# Patient Record
Sex: Male | Born: 1955
Health system: Southern US, Community
[De-identification: ages and names within clinical notes are randomized; demographics above are authoritative.]

## PROBLEM LIST (undated history)

## (undated) DIAGNOSIS — R519 Headache, unspecified: Secondary | ICD-10-CM

## (undated) DIAGNOSIS — F1011 Alcohol abuse, in remission: Secondary | ICD-10-CM

## (undated) DIAGNOSIS — I10 Essential (primary) hypertension: Secondary | ICD-10-CM

## (undated) DIAGNOSIS — G709 Myoneural disorder, unspecified: Secondary | ICD-10-CM

## (undated) DIAGNOSIS — F4310 Post-traumatic stress disorder, unspecified: Secondary | ICD-10-CM

## (undated) DIAGNOSIS — R42 Dizziness and giddiness: Secondary | ICD-10-CM

## (undated) DIAGNOSIS — E119 Type 2 diabetes mellitus without complications: Secondary | ICD-10-CM

## (undated) DIAGNOSIS — R413 Other amnesia: Secondary | ICD-10-CM

## (undated) DIAGNOSIS — F0781 Postconcussional syndrome: Secondary | ICD-10-CM

## (undated) DIAGNOSIS — M1990 Unspecified osteoarthritis, unspecified site: Secondary | ICD-10-CM

## (undated) DIAGNOSIS — F1010 Alcohol abuse, uncomplicated: Secondary | ICD-10-CM

## (undated) DIAGNOSIS — T8859XA Other complications of anesthesia, initial encounter: Secondary | ICD-10-CM

## (undated) DIAGNOSIS — R195 Other fecal abnormalities: Secondary | ICD-10-CM

## (undated) HISTORY — DX: Dizziness and giddiness: R42

## (undated) HISTORY — PX: BACK SURGERY: SHX140

## (undated) HISTORY — DX: Headache, unspecified: R51.9

## (undated) HISTORY — PX: NECK SURGERY: SHX720

## (undated) HISTORY — DX: Type 2 diabetes mellitus without complications: E11.9

## (undated) HISTORY — DX: Other fecal abnormalities: R19.5

## (undated) HISTORY — DX: Postconcussional syndrome: F07.81

## (undated) HISTORY — PX: TONSILLECTOMY: SUR1361

## (undated) HISTORY — DX: Alcohol abuse, in remission: F10.11

## (undated) HISTORY — DX: Other amnesia: R41.3

---

## 2000-07-31 ENCOUNTER — Encounter: Payer: Self-pay | Admitting: Emergency Medicine

## 2000-07-31 ENCOUNTER — Emergency Department (HOSPITAL_COMMUNITY): Admission: EM | Admit: 2000-07-31 | Discharge: 2000-07-31 | Payer: Self-pay | Admitting: Emergency Medicine

## 2000-09-16 ENCOUNTER — Encounter: Payer: Self-pay | Admitting: Specialist

## 2000-09-23 ENCOUNTER — Inpatient Hospital Stay (HOSPITAL_COMMUNITY): Admission: RE | Admit: 2000-09-23 | Discharge: 2000-09-24 | Payer: Self-pay | Admitting: Specialist

## 2000-09-23 ENCOUNTER — Encounter: Payer: Self-pay | Admitting: Specialist

## 2005-05-05 ENCOUNTER — Ambulatory Visit: Payer: Self-pay | Admitting: Internal Medicine

## 2005-05-12 ENCOUNTER — Ambulatory Visit: Payer: Self-pay | Admitting: Internal Medicine

## 2005-07-06 ENCOUNTER — Ambulatory Visit: Payer: Self-pay | Admitting: Internal Medicine

## 2005-07-16 ENCOUNTER — Ambulatory Visit: Payer: Self-pay | Admitting: Internal Medicine

## 2005-09-09 ENCOUNTER — Ambulatory Visit: Payer: Self-pay | Admitting: Internal Medicine

## 2005-09-16 ENCOUNTER — Ambulatory Visit: Payer: Self-pay | Admitting: Internal Medicine

## 2005-12-15 ENCOUNTER — Ambulatory Visit: Payer: Self-pay | Admitting: Internal Medicine

## 2006-12-31 ENCOUNTER — Ambulatory Visit: Payer: Self-pay | Admitting: Internal Medicine

## 2007-03-23 DIAGNOSIS — I10 Essential (primary) hypertension: Secondary | ICD-10-CM | POA: Insufficient documentation

## 2007-03-23 DIAGNOSIS — J309 Allergic rhinitis, unspecified: Secondary | ICD-10-CM | POA: Insufficient documentation

## 2007-03-23 DIAGNOSIS — E785 Hyperlipidemia, unspecified: Secondary | ICD-10-CM | POA: Insufficient documentation

## 2007-05-11 ENCOUNTER — Telehealth: Payer: Self-pay | Admitting: Internal Medicine

## 2010-09-30 NOTE — Progress Notes (Signed)
Summary: name of endocrinologist  Phone Note Call from Patient Call back at Home Phone [phone]   Caller: Patient Call For: Aanyah Loa Reason for Call: Referral Summary of Call: NEED A REFERRAL FOR ENDOCRINOLOGIST  DO YOU ACCEPT NEW PTS?  PLS CALL FOR REFERRAL Initial call taken by: Shan Levans,  May 11, 2007 3:44 PM  Follow-up for Phone Call        more information---I don't understand the question Follow-up by: Birdie Sons MD,  May 11, 2007 4:03 PM  Additional Follow-up for Phone Call Additional follow up Details #1::        LMTCB ..................................................................Marland KitchenSid Falcon LPN  May 11, 2007 6:02 PM  Called pt back, found out pt. step daughter has a nodule on her neck, identified on sonogram and a needle biopsy has been done.  They are needing an Actor.  On Internet search they found your name, Dr Birdie Sons come up.  Only other one in this area is Dr Criss Alvine, and in their checking, no one has heard of him/her.  They are looking for someone in the [employer].  Any advice?  Additional Follow-up by: Sid Falcon LPN,  May 12, 2007 5:22 PM    Additional Follow-up for Phone Call Additional follow up Details #2::    refer to Dr. Everardo All Follow-up by: Birdie Sons MD,  May 12, 2007 9:10 PM  Additional Follow-up for Phone Call Additional follow up Details #3:: Details for Additional Follow-up Action Taken: Patient called with information. Additional Follow-up by: Rudy Jew, RN,  May 13, 2007 3:04 PM

## 2016-08-13 ENCOUNTER — Ambulatory Visit (HOSPITAL_COMMUNITY)
Admission: RE | Admit: 2016-08-13 | Discharge: 2016-08-13 | Disposition: A | Discharge status: Home / Self Care | Payer: Self-pay | Attending: Psychiatry | Admitting: Psychiatry

## 2016-08-13 DIAGNOSIS — F418 Other specified anxiety disorders: Secondary | ICD-10-CM | POA: Insufficient documentation

## 2016-08-13 DIAGNOSIS — F1910 Other psychoactive substance abuse, uncomplicated: Secondary | ICD-10-CM | POA: Insufficient documentation

## 2016-08-13 NOTE — BH Assessment (Signed)
Tele Assessment Note   Chris Flynn is an 60 y.o. male. Pt reports increased depression, anxiety, and alcohol abuse due to recent family trauma. The Pt states that he his family trauma occurred his world changed and his life has never been the same. The Pt denies mental health symptoms or alcohol abuse before the trauma occurred. The Pt is seen by his PCP for withdrawal symptoms. The Pt is seen by a therapist 1x a month to discuss his feeling and emotions. The Pt denies SI/HI and AVH. The Pt denies previous inpatient treatment. The Pt states he is prescribed Lorazepam by his PCP. The Pt states he is seeking the help of a psychiatrist for medication management.   Writer consulted with Margarita Grizzle, NP. Per Margarita Grizzle Pt does not meet inpatient criteria. Pt provided the Pt with resources.  Diagnosis: F33.1 MDD  Past Medical History: No past medical history on file.  No past surgical history on file.  Family History: No family history on file.  Social History:  has no tobacco, alcohol, and drug history on file.  Additional Social History:  Alcohol / Drug Use Pain Medications: Pt denies Prescriptions: Lorezapam Over the Counter: Pt denies History of alcohol / drug use?: Yes Longest period of sobriety (when/how long): unknown Withdrawal Symptoms: Agitation, Sweats, Fever / Chills, Irritability Substance #1 Name of Substance 1: alcohol 1 - Age of First Use: unknown 1 - Amount (size/oz): unknown 1 - Frequency: occasional 1 - Duration: ongoing 1 - Last Use / Amount: unknown  CIWA: CIWA-Ar BP: 136/82 Pulse Rate: 94 COWS:    PATIENT STRENGTHS: (choose at least two) Capable of independent living Communication skills  Allergies:  Allergies  Allergen Reactions  . Codeine     REACTION: Facial numbness    Home Medications:  (Not in a hospital admission)  OB/GYN Status:  No LMP for male patient.  General Assessment Data Location of Assessment: [facility] Assessment Services TTS Assessment:  In system Is this a Tele or Face-to-Face Assessment?: Face-to-Face Is this an Initial Assessment or a Re-assessment for this encounter?: Initial Assessment Marital status: Married [place] name: NA Is patient pregnant?: No Pregnancy Status: No Living Arrangements: Spouse/significant other Can pt return to current living arrangement?: Yes Admission Status: Voluntary Is patient capable of signing voluntary admission?: Yes Referral Source: Self/Family/Friend Insurance type: Insurance risk surveyor Exam ([place]) Medical Exam completed: Yes (completed by Margarita Grizzle, NP)  Crisis Care Plan Living Arrangements: Spouse/significant other Legal Guardian: Other: (self) Name of Psychiatrist: NA Name of Therapist: [place] [facility]  Education Status Is patient currently in school?: No Current Grade: NA Highest grade of school patient has completed: college Name of school: NA Contact person: NA  Risk to self with the past 6 months Suicidal Ideation: No Has patient been a risk to self within the past 6 months prior to admission? : No Suicidal Intent: No Has patient had any suicidal intent within the past 6 months prior to admission? : No Is patient at risk for suicide?: No Suicidal Plan?: No Has patient had any suicidal plan within the past 6 months prior to admission? : No Access to Means: No What has been your use of drugs/alcohol within the last 12 months?: NA Previous Attempts/Gestures: No How many times?: 0 Other Self Harm Risks: NA Triggers for Past Attempts: None known Intentional Self Injurious Behavior: None Family Suicide History: No Recent stressful life event(s): Trauma (Comment), Turmoil (Comment) Persecutory voices/beliefs?: No Depression: Yes Depression Symptoms: Isolating, Tearfulness, Loss of interest  in usual pleasures, Feeling angry/irritable Substance abuse history and/or treatment for substance abuse?: No Suicide prevention information given to  non-admitted patients: Not applicable  Risk to Others within the past 6 months Homicidal Ideation: No Does patient have any lifetime risk of violence toward others beyond the six months prior to admission? : No Thoughts of Harm to Others: No Current Homicidal Intent: No Current Homicidal Plan: No Access to Homicidal Means: No Identified Victim: NA History of harm to others?: No Assessment of Violence: None Noted Violent Behavior Description: NA Does patient have access to weapons?: No Criminal Charges Pending?: No Does patient have a court date: No Is patient on probation?: No  Psychosis Hallucinations: None noted Delusions: None noted  Mental Status Report Appearance/Hygiene: Unremarkable Eye Contact: Good Motor Activity: Freedom of movement Speech: Logical/coherent Level of Consciousness: Alert Mood: Sad Affect: Sad Anxiety Level: Minimal Thought Processes: Coherent, Relevant Judgement: Unimpaired Orientation: Person, Place, Situation, Time, Appropriate for developmental age Obsessive Compulsive Thoughts/Behaviors: None  Cognitive Functioning Concentration: Normal Memory: Recent Intact, Remote Intact IQ: Average Insight: Good Impulse Control: Good Appetite: Good Weight Loss: 0 Weight Gain: 0 Sleep: Decreased Total Hours of Sleep: 6 Vegetative Symptoms: None  ADLScreening [facility]) Patient's cognitive ability adequate to safely complete daily activities?: Yes Patient able to express need for assistance with ADLs?: Yes Independently performs ADLs?: Yes (appropriate for developmental age)  Prior Inpatient Therapy Prior Inpatient Therapy: No Prior Therapy Dates: NA Prior Therapy Facilty/Provider(s): NA Reason for Treatment: NA  Prior Outpatient Therapy Prior Outpatient Therapy: No Prior Therapy Dates: NA Prior Therapy Facilty/Provider(s): nA Reason for Treatment: NA Does patient have an ACCT team?: No Does patient have Intensive In-House  Services?  : No Does patient have [facility] services? : No Does patient have P4CC services?: No  ADL Screening (condition at time of admission) Patient's cognitive ability adequate to safely complete daily activities?: Yes Is the patient deaf or have difficulty hearing?: No Does the patient have difficulty seeing, even when wearing glasses/contacts?: No Does the patient have difficulty concentrating, remembering, or making decisions?: No Patient able to express need for assistance with ADLs?: Yes Does the patient have difficulty dressing or bathing?: No Independently performs ADLs?: Yes (appropriate for developmental age) Does the patient have difficulty walking or climbing stairs?: No Weakness of Legs: None Weakness of Arms/Hands: None       Abuse/Neglect Assessment (Assessment to be complete while patient is alone) Physical Abuse: Denies Verbal Abuse: Denies Sexual Abuse: Denies Exploitation of patient/patient's resources: Denies Self-Neglect: Denies     Regulatory affairs officer (For Healthcare) Does Patient Have a Medical Advance Directive?: No    Additional Information 1:1 In Past 12 Months?: No CIRT Risk: No Elopement Risk: No Does patient have medical clearance?: Yes     Disposition:  Disposition Initial Assessment Completed for this Encounter: Yes Disposition of Patient: Outpatient treatment Type of outpatient treatment: Adult  Rael Tilly D 08/13/2016 11:43 AM

## 2016-08-13 NOTE — H&P (Signed)
Behavioral Health Medical Screening Exam  Chris Flynn is an 60 y.o. male.  Total Time spent with patient: 15 minutes  Psychiatric Specialty Exam: Physical Exam  Constitutional: He is oriented to person, place, and time. He appears well-developed and well-nourished.  HENT:  Head: Normocephalic.  Neck: Normal range of motion.  Cardiovascular: Normal rate and normal heart sounds.   Respiratory: Effort normal and breath sounds normal.  GI: Soft.  Musculoskeletal: Normal range of motion.  Neurological: He is alert and oriented to person, place, and time.  Skin: Skin is warm and dry.    Review of Systems  Psychiatric/Behavioral: Positive for depression and substance abuse. Negative for hallucinations, memory loss and suicidal ideas. The patient is nervous/anxious. The patient does not have insomnia.     BP 136/82 (BP Location: Right Arm)   Pulse 94   Temp 99.2 F (37.3 C) (Oral)   Resp 18   SpO2 96%    General Appearance: Casual and Fairly Groomed  Eye Contact:  Good  Speech:  Clear and Coherent and Normal Rate  Volume:  Normal  Mood:  Anxious and Depressed  Affect:  Congruent and Depressed  Thought Process:  Coherent, Goal Directed and Linear  Orientation:  Full (Time, Place, and Person)  Thought Content:  Illogical  Suicidal Thoughts:  No  Homicidal Thoughts:  No  Memory:  Immediate;   Good Recent;   Good Remote;   Good  Judgement:  Good  Insight:  Good  Psychomotor Activity:  Normal  Concentration: Concentration: Good and Attention Span: Good  Recall:  Good  Fund of Knowledge:Good  Language: Good  Akathisia:  No  Handed:  Right  AIMS (if indicated):     Assets:  Communication Skills Desire for Improvement Financial Resources/Insurance Housing Resilience Social Support Transportation Vocational/Educational  Sleep:       Musculoskeletal: Strength & Muscle Tone: within normal limits Gait & Station: normal Patient leans: N/A  BP 136/82 (BP Location:  Right Arm)   Pulse 94   Temp 99.2 F (37.3 C) (Oral)   Resp 18   SpO2 96%   Recommendations:  Pt does not meet criteria for inpatient psychiatric admission. Outpatient resources were provided  Ethelene Hal, NP 08/13/2016, 11:36 AM

## 2017-03-18 ENCOUNTER — Encounter (HOSPITAL_COMMUNITY): Payer: Self-pay | Admitting: *Deleted

## 2017-03-18 ENCOUNTER — Inpatient Hospital Stay (HOSPITAL_COMMUNITY)
Admission: EM | Admit: 2017-03-18 | Discharge: 2017-03-22 | DRG: 682 | Disposition: A | Discharge status: Home / Self Care | Payer: BLUE CROSS/BLUE SHIELD | Attending: Internal Medicine | Admitting: Internal Medicine

## 2017-03-18 ENCOUNTER — Inpatient Hospital Stay (HOSPITAL_COMMUNITY): Payer: BLUE CROSS/BLUE SHIELD

## 2017-03-18 DIAGNOSIS — F4310 Post-traumatic stress disorder, unspecified: Secondary | ICD-10-CM | POA: Diagnosis present

## 2017-03-18 DIAGNOSIS — K859 Acute pancreatitis without necrosis or infection, unspecified: Secondary | ICD-10-CM | POA: Diagnosis present

## 2017-03-18 DIAGNOSIS — Z79899 Other long term (current) drug therapy: Secondary | ICD-10-CM

## 2017-03-18 DIAGNOSIS — E876 Hypokalemia: Secondary | ICD-10-CM | POA: Diagnosis present

## 2017-03-18 DIAGNOSIS — K852 Alcohol induced acute pancreatitis without necrosis or infection: Secondary | ICD-10-CM

## 2017-03-18 DIAGNOSIS — N179 Acute kidney failure, unspecified: Principal | ICD-10-CM

## 2017-03-18 DIAGNOSIS — E869 Volume depletion, unspecified: Secondary | ICD-10-CM | POA: Diagnosis present

## 2017-03-18 DIAGNOSIS — R740 Nonspecific elevation of levels of transaminase and lactic acid dehydrogenase [LDH]: Secondary | ICD-10-CM | POA: Diagnosis present

## 2017-03-18 DIAGNOSIS — D696 Thrombocytopenia, unspecified: Secondary | ICD-10-CM | POA: Diagnosis present

## 2017-03-18 DIAGNOSIS — F419 Anxiety disorder, unspecified: Secondary | ICD-10-CM | POA: Diagnosis present

## 2017-03-18 DIAGNOSIS — E871 Hypo-osmolality and hyponatremia: Secondary | ICD-10-CM

## 2017-03-18 DIAGNOSIS — R7401 Elevation of levels of liver transaminase levels: Secondary | ICD-10-CM

## 2017-03-18 DIAGNOSIS — M6282 Rhabdomyolysis: Secondary | ICD-10-CM | POA: Diagnosis present

## 2017-03-18 DIAGNOSIS — F10930 Alcohol use, unspecified with withdrawal, uncomplicated: Secondary | ICD-10-CM

## 2017-03-18 DIAGNOSIS — F10230 Alcohol dependence with withdrawal, uncomplicated: Secondary | ICD-10-CM

## 2017-03-18 DIAGNOSIS — F1020 Alcohol dependence, uncomplicated: Secondary | ICD-10-CM | POA: Diagnosis not present

## 2017-03-18 DIAGNOSIS — I10 Essential (primary) hypertension: Secondary | ICD-10-CM | POA: Diagnosis present

## 2017-03-18 DIAGNOSIS — F10239 Alcohol dependence with withdrawal, unspecified: Secondary | ICD-10-CM | POA: Diagnosis not present

## 2017-03-18 DIAGNOSIS — Y906 Blood alcohol level of 120-199 mg/100 ml: Secondary | ICD-10-CM | POA: Diagnosis present

## 2017-03-18 DIAGNOSIS — R252 Cramp and spasm: Secondary | ICD-10-CM | POA: Diagnosis not present

## 2017-03-18 HISTORY — DX: Essential (primary) hypertension: I10

## 2017-03-18 HISTORY — DX: Post-traumatic stress disorder, unspecified: F43.10

## 2017-03-18 HISTORY — DX: Alcohol abuse, uncomplicated: F10.10

## 2017-03-18 LAB — COMPREHENSIVE METABOLIC PANEL
ALT: 62 U/L (ref 17–63)
AST: 170 U/L — ABNORMAL HIGH (ref 15–41)
Albumin: 4.1 g/dL (ref 3.5–5.0)
Alkaline Phosphatase: 77 U/L (ref 38–126)
Anion gap: 18 — ABNORMAL HIGH (ref 5–15)
BUN: 25 mg/dL — ABNORMAL HIGH (ref 6–20)
CO2: 17 mmol/L — ABNORMAL LOW (ref 22–32)
Calcium: 7.3 mg/dL — ABNORMAL LOW (ref 8.9–10.3)
Chloride: 90 mmol/L — ABNORMAL LOW (ref 101–111)
Creatinine, Ser: 2.53 mg/dL — ABNORMAL HIGH (ref 0.61–1.24)
GFR calc Af Amer: 30 mL/min — ABNORMAL LOW (ref >60)
GFR calc non Af Amer: 26 mL/min — ABNORMAL LOW (ref >60)
Glucose, Bld: 126 mg/dL — ABNORMAL HIGH (ref 65–99)
Potassium: 3.4 mmol/L — ABNORMAL LOW (ref 3.5–5.1)
Sodium: 125 mmol/L — ABNORMAL LOW (ref 135–145)
Total Bilirubin: 1 mg/dL (ref 0.3–1.2)
Total Protein: 7.8 g/dL (ref 6.5–8.1)

## 2017-03-18 LAB — CBC WITH DIFFERENTIAL/PLATELET
Basophils Absolute: 0 10*3/uL (ref 0.0–0.1)
Basophils Relative: 0 %
Eosinophils Absolute: 0 10*3/uL (ref 0.0–0.7)
Eosinophils Relative: 0 %
HCT: 36.2 % — ABNORMAL LOW (ref 39.0–52.0)
Hemoglobin: 13.3 g/dL (ref 13.0–17.0)
Lymphocytes Relative: 10 %
Lymphs Abs: 1.2 10*3/uL (ref 0.7–4.0)
MCH: 35.6 pg — ABNORMAL HIGH (ref 26.0–34.0)
MCHC: 36.7 g/dL — ABNORMAL HIGH (ref 30.0–36.0)
MCV: 96.8 fL (ref 78.0–100.0)
Monocytes Absolute: 1.4 10*3/uL — ABNORMAL HIGH (ref 0.1–1.0)
Monocytes Relative: 11 %
Neutro Abs: 9.5 10*3/uL — ABNORMAL HIGH (ref 1.7–7.7)
Neutrophils Relative %: 78 %
Platelets: 124 10*3/uL — ABNORMAL LOW (ref 150–400)
RBC: 3.74 MIL/uL — ABNORMAL LOW (ref 4.22–5.81)
RDW: 12.7 % (ref 11.5–15.5)
WBC: 12.2 10*3/uL — ABNORMAL HIGH (ref 4.0–10.5)

## 2017-03-18 LAB — RAPID URINE DRUG SCREEN, HOSP PERFORMED
Amphetamines: NOT DETECTED
Barbiturates: NOT DETECTED
Benzodiazepines: NOT DETECTED
Cocaine: NOT DETECTED
Opiates: NOT DETECTED
Tetrahydrocannabinol: NOT DETECTED

## 2017-03-18 LAB — PHOSPHORUS: Phosphorus: 4.3 mg/dL (ref 2.5–4.6)

## 2017-03-18 LAB — PROTIME-INR
INR: 1.02
Prothrombin Time: 13.4 seconds (ref 11.4–15.2)

## 2017-03-18 LAB — CK: Total CK: 5734 U/L — ABNORMAL HIGH (ref 49–397)

## 2017-03-18 LAB — ETHANOL: Alcohol, Ethyl (B): 170 mg/dL — ABNORMAL HIGH (ref <5)

## 2017-03-18 LAB — TSH: TSH: 2.528 u[IU]/mL (ref 0.350–4.500)

## 2017-03-18 LAB — MAGNESIUM: Magnesium: 0.6 mg/dL — CL (ref 1.7–2.4)

## 2017-03-18 MED ORDER — LORAZEPAM 2 MG/ML IJ SOLN: 1.0000 mg | Freq: Four times a day (QID) | INTRAMUSCULAR | Status: AC | PRN

## 2017-03-18 MED ORDER — LORAZEPAM 2 MG/ML IJ SOLN
2.0000 mg | Freq: Once | INTRAMUSCULAR | Status: AC
  Administered 2017-03-18: 2 mg via INTRAVENOUS
  Filled 2017-03-18: qty 1

## 2017-03-18 MED ORDER — LORAZEPAM 1 MG PO TABS: 1.0000 mg | ORAL_TABLET | Freq: Three times a day (TID) | ORAL | Status: DC

## 2017-03-18 MED ORDER — FOLIC ACID 1 MG PO TABS
1.0000 mg | ORAL_TABLET | Freq: Every day | ORAL | Status: DC
  Administered 2017-03-18 – 2017-03-22 (×5): 1 mg via ORAL
  Filled 2017-03-18 (×5): qty 1

## 2017-03-18 MED ORDER — CARVEDILOL 3.125 MG PO TABS
6.2500 mg | ORAL_TABLET | Freq: Two times a day (BID) | ORAL | Status: DC
  Administered 2017-03-18 – 2017-03-22 (×7): 6.25 mg via ORAL
  Filled 2017-03-18 (×8): qty 2

## 2017-03-18 MED ORDER — ONDANSETRON 4 MG PO TBDP
ORAL_TABLET | ORAL | Status: AC
  Administered 2017-03-18: 4 mg
  Filled 2017-03-18: qty 1

## 2017-03-18 MED ORDER — LORAZEPAM 2 MG/ML IJ SOLN: 0.0000 mg | Freq: Two times a day (BID) | INTRAMUSCULAR | Status: DC

## 2017-03-18 MED ORDER — THIAMINE HCL 100 MG/ML IJ SOLN: 100.0000 mg | Freq: Every day | INTRAMUSCULAR | Status: DC

## 2017-03-18 MED ORDER — VITAMIN B-1 100 MG PO TABS
100.0000 mg | ORAL_TABLET | Freq: Every day | ORAL | Status: DC
Start: 2017-03-18 — End: 2017-03-22
  Administered 2017-03-18 – 2017-03-22 (×5): 100 mg via ORAL
  Filled 2017-03-18 (×5): qty 1

## 2017-03-18 MED ORDER — LORAZEPAM 1 MG PO TABS: 1.0000 mg | ORAL_TABLET | Freq: Four times a day (QID) | ORAL | Status: AC | PRN

## 2017-03-18 MED ORDER — MAGNESIUM SULFATE 4 GM/100ML IV SOLN
4.0000 g | Freq: Once | INTRAVENOUS | Status: AC
  Administered 2017-03-18: 4 g via INTRAVENOUS
  Filled 2017-03-18 (×2): qty 100

## 2017-03-18 MED ORDER — ADULT MULTIVITAMIN W/MINERALS CH
1.0000 | ORAL_TABLET | Freq: Every day | ORAL | Status: DC
  Administered 2017-03-18 – 2017-03-22 (×5): 1 via ORAL
  Filled 2017-03-18 (×5): qty 1

## 2017-03-18 MED ORDER — SODIUM CHLORIDE 0.9 % IV BOLUS (SEPSIS)
1000.0000 mL | Freq: Once | INTRAVENOUS | Status: AC
  Administered 2017-03-18: 1000 mL via INTRAVENOUS

## 2017-03-18 MED ORDER — LORAZEPAM 2 MG/ML IJ SOLN
0.0000 mg | Freq: Four times a day (QID) | INTRAMUSCULAR | Status: DC
  Administered 2017-03-18: 2 mg via INTRAVENOUS
  Administered 2017-03-19: 1 mg via INTRAVENOUS
  Filled 2017-03-18: qty 1
  Filled 2017-03-18: qty 2

## 2017-03-18 MED ORDER — SODIUM CHLORIDE 0.9% FLUSH
3.0000 mL | Freq: Two times a day (BID) | INTRAVENOUS | Status: DC
  Administered 2017-03-18 – 2017-03-19 (×3): 3 mL via INTRAVENOUS

## 2017-03-18 MED ORDER — SODIUM CHLORIDE 0.9 % IV SOLN
2.0000 g | Freq: Once | INTRAVENOUS | Status: AC
  Administered 2017-03-18: 2 g via INTRAVENOUS
  Filled 2017-03-18: qty 20

## 2017-03-18 MED ORDER — KCL IN DEXTROSE-NACL 20-5-0.9 MEQ/L-%-% IV SOLN
INTRAVENOUS | Status: DC
  Administered 2017-03-18 – 2017-03-22 (×11): via INTRAVENOUS

## 2017-03-18 NOTE — ED Notes (Signed)
Dr. Le in room

## 2017-03-18 NOTE — ED Triage Notes (Signed)
Flank pain radiating into lower abdomen, denies nausea or vomiting

## 2017-03-18 NOTE — H&P (Signed)
History and Physical    Chris Flynn ZJI:[id number] DOB: 1955/11/30 DOA: 03/18/2017  PCP: Celene Squibb, MD  Patient coming from: Home.    Chief Complaint:   Muscle cramps.    HPI: Chris Flynn is an 61 y.o. male with hx of heavy alcohol abuse (1/2 pint Vodka daily), hx of anxiety on Ativan 1mg  TID (he takes less), HTN on Losartan HCT, PTSS, presented today after several days of leg cramps.  He denied fever or chills.  Work up in the ER included a CPK of 5K, Cr of 2.5, K of 3.4 and Na of 125.  His calcium was also found to be low at 7.3 with normal albumin.  EKG showed ST at 110 without any acute changes.  He was given IV ativan, and hospitalist was asked to admit him for alcohol withdrawal, AKI, mild rhabdo and cramps.   ED Course:  See above.  Rewiew of Systems:  Constitutional: Negative for malaise, fever and chills. No significant weight loss or weight gain Eyes: Negative for eye pain, redness and discharge, diplopia, visual changes, or flashes of light. ENMT: Negative for ear pain, hoarseness, nasal congestion, sinus pressure and sore throat. No headaches; tinnitus, drooling, or problem swallowing. Cardiovascular: Negative for chest pain, palpitations, diaphoresis, dyspnea and peripheral edema. ; No orthopnea, PND Respiratory: Negative for cough, hemoptysis, wheezing and stridor. No pleuritic chestpain. Gastrointestinal: Negative for diarrhea, constipation,  melena, blood in stool, hematemesis, jaundice and rectal bleeding.    Genitourinary: Negative for frequency, dysuria, incontinence,flank pain and hematuria; Musculoskeletal: Negative for back pain and neck pain. Negative for swelling and trauma.;  Skin: . Negative for pruritus, rash, abrasions, bruising and skin lesion.; ulcerations Neuro: Negative for headache, lightheadedness and neck stiffness. Negative for weakness, altered level of consciousness , altered mental status, extremity weakness, burning feet, involuntary  movement, seizure and syncope.  Psych: negative for anxiety, depression, insomnia, tearfulness, panic attacks, hallucinations, paranoia, suicidal or homicidal ideation    Past Medical History:  Diagnosis Date  . Alcohol abuse   . Hypertension   . PTSD (post-traumatic stress disorder)     Past Surgical History:  Procedure Laterality Date  . NECK SURGERY       reports that he has never smoked. He has never used smokeless tobacco. He reports that he drinks alcohol. He reports that he does not use drugs.  Allergies  Allergen Reactions  . Codeine     REACTION: Facial numbness    No family history on file.   Prior to Admission medications   Medication Sig Start Date End Date Taking? Authorizing Provider  carvedilol (COREG) 6.25 MG tablet Take 1 tablet by mouth 2 (two) times daily. 03/12/17  Yes [provider]  LORazepam (ATIVAN) 1 MG tablet Take 1 tablet by mouth 3 (three) times daily. 03/13/17  Yes [provider]  losartan-hydrochlorothiazide (HYZAAR) 100-25 MG tablet Take 1 tablet by mouth daily. 03/12/17  Yes [provider]    Physical Exam: Vitals:   03/18/17 1845 03/18/17 1900 03/18/17 1912 03/18/17 1930  BP:  (!) 147/90 (!) 147/90 (!) 141/81  Pulse: (!) 111 (!) 110 98 (!) 106  Resp:   19   Temp:      TempSrc:      SpO2: 95% 94% 94% 95%      Constitutional: NAD, calm, comfortable Vitals:   03/18/17 1845 03/18/17 1900 03/18/17 1912 03/18/17 1930  BP:  (!) 147/90 (!) 147/90 (!) 141/81  Pulse: (!) 111 (!) 110  98 (!) 106  Resp:   19   Temp:      TempSrc:      SpO2: 95% 94% 94% 95%   Eyes: PERRL, lids and conjunctivae normal ENMT: Mucous membranes are moist. Posterior pharynx clear of any exudate or lesions.Normal dentition.  Neck: normal, supple, no masses, no thyromegaly Respiratory: clear to auscultation bilaterally, no wheezing, no crackles. Normal respiratory effort. No accessory muscle use.  Cardiovascular: Regular rate and  rhythm, no murmurs / rubs / gallops. No extremity edema. 2+ pedal pulses. No carotid bruits.  Abdomen: no tenderness, no masses palpated. No hepatosplenomegaly. Bowel sounds positive.  Musculoskeletal: no clubbing / cyanosis. No joint deformity upper and lower extremities. Good ROM, no contractures. Normal muscle tone.  Skin: no rashes, lesions, ulcers. No induration Neurologic: CN 2-12 grossly intact. Sensation intact, DTR normal. Strength 5/5 in all 4. Mild tremor.  Psychiatric: Normal judgment and insight. Alert and oriented x 3. Normal mood.    Labs on Admission: I have personally reviewed following labs and imaging studies  CBC:  Recent Labs Lab 03/18/17 1626  WBC 12.2*  NEUTROABS 9.5*  HGB 13.3  HCT 36.2*  MCV 96.8  PLT 300*   Basic Metabolic Panel:  Recent Labs Lab 03/18/17 1626  NA 125*  K 3.4*  CL 90*  CO2 17*  GLUCOSE 126*  BUN 25*  CREATININE 2.53*  CALCIUM 7.3*   GFR: CrCl cannot be calculated (Unknown ideal weight.). Liver Function Tests:  Recent Labs Lab 03/18/17 1626  AST 170*  ALT 62  ALKPHOS 77  BILITOT 1.0  PROT 7.8  ALBUMIN 4.1   No results for input(s): LIPASE, AMYLASE in the last 168 hours. No results for input(s): AMMONIA in the last 168 hours. Coagulation Profile: No results for input(s): INR, PROTIME in the last 168 hours. Cardiac Enzymes:  Recent Labs Lab 03/18/17 1626  CKTOTAL 5,734*   EKG: Independently reviewed.   Assessment/Plan Principal Problem:   AKI (acute kidney injury) ([place]) Active Problems:   Rhabdomyolysis   Alcohol dependence with uncomplicated withdrawal ([place])   Hypocalcemia   PLAN:   AKI:  I suspect he is volume depleted, being on diuretic and ACE I, and drinking alcohol.  Will give aggressive IVF.  Follow cr carefully.  Hold or d/c Losartan HCT.  Alcohol withdrawal:  His current alcohol level is 170.  He may get worse in day 2 or day 3.  Will use standing tapering dose of Ativan, along with giving  Dextrose, MVI, folate and Thiamine.  Will admit to telemetry, and transfer to ICU if required.  Rhabdo:  Could be from electrolyte abnormality, or tremor.  History did not suggest that he had a seizure.    Hypocalcemia:  Will give IV supplement.    Alcohol abuse:  Will need to address again at a later time.    DVT prophylaxis: SCD.  Code Status: FULL CODE.  Family Communication: none at bedside.  Disposition Plan: Home.  Consults called: None. Admission status: FULL Haskel Khan MD FACP. [facility]  If 7PM-7AM, please contact night-coverage [email or web address] Password [facility]  03/18/2017, 7:56 PM

## 2017-03-18 NOTE — ED Triage Notes (Signed)
States he is an alcoholic and has been trying to cut back, today is having diarrhea,  Nausea, and vomiting, last alcohol around noon

## 2017-03-18 NOTE — ED Notes (Signed)
Patient transported to X-ray 

## 2017-03-18 NOTE — ED Provider Notes (Signed)
Emergency Department Provider Note   I have reviewed the triage vital signs and the nursing notes.   HISTORY  Chief Complaint Withdrawal   HPI Chris Flynn is a 61 y.o. male with PMH of alcohol abuse and HTN presents to the emergency room in for evaluation of abdominal discomfort, diarrhea, muscle cramping, and alcohol abuse. Patient states he drinks approximately half pint of liquor daily for the last month. He has no history of seizure or delirium tremens when abstaining from alcohol. Over the past 2 weeks he's had worsening diarrhea and abdominal discomfort which she attributed to his drinking. The symptoms have become worse over the past several days and so he decided that he should get help with his drinking. Denies drug use. No new medications. No fevers or chills. Denies chest pain or difficulty breathing. Patient states he follows with Dr. Nevada Crane, as his PCP, and reports normal blood work 6 months prior.    Past Medical History:  Diagnosis Date  . Alcohol abuse   . Hypertension   . PTSD (post-traumatic stress disorder)     Patient Active Problem List   Diagnosis Date Noted  . Rhabdomyolysis 03/18/2017  . AKI (acute kidney injury) ([place]) 03/18/2017  . Alcohol dependence with uncomplicated withdrawal ([place]) 03/18/2017  . Hypocalcemia 03/18/2017  . HYPERLIPIDEMIA 03/23/2007  . HYPERTENSION 03/23/2007  . ALLERGIC RHINITIS 03/23/2007    Past Surgical History:  Procedure Laterality Date  . NECK SURGERY        Allergies Codeine  No family history on file.  Social History Social History  Substance Use Topics  . Smoking status: Never Smoker  . Smokeless tobacco: Never Used  . Alcohol use Yes    Review of Systems  Constitutional: No fever/chills Eyes: No visual changes. ENT: No sore throat. Cardiovascular: Denies chest pain. Respiratory: Denies shortness of breath. Gastrointestinal: Positive diffuse abdominal pain. Positive nausea, no vomiting. Positive  diarrhea.  No constipation. Genitourinary: Negative for dysuria. Musculoskeletal: Negative for back pain. Positive muscle cramping.  Skin: Negative for rash. Neurological: Negative for headaches, focal weakness or numbness.  10-point ROS otherwise negative.  ____________________________________________   PHYSICAL EXAM:  VITAL SIGNS: ED Triage Vitals [03/18/17 1521]  Enc Vitals Group     BP (!) 148/80     Pulse Rate 98     Resp 20     Temp 98.2 F (36.8 C)     Temp Source Oral     SpO2 99 %     Pain Score 10   Constitutional: Alert and oriented. Well appearing and in no acute distress. Eyes: Conjunctivae are normal.  Head: Atraumatic. Nose: No congestion/rhinnorhea. Mouth/Throat: Mucous membranes are dry.  Neck: No stridor.  Cardiovascular: Normal rate, regular rhythm. Good peripheral circulation. Grossly normal heart sounds.   Respiratory: Normal respiratory effort.  No retractions. Lungs CTAB. Gastrointestinal: Soft and nontender. No distention.  Musculoskeletal: No lower extremity tenderness nor edema. No gross deformities of extremities. Neurologic:  Normal speech and language. No gross focal neurologic deficits are appreciated.  Skin:  Skin is warm, dry and intact. No rash noted.  ____________________________________________   LABS (all labs ordered are listed, but only abnormal results are displayed)  Labs Reviewed  ETHANOL - Abnormal; Notable for the following:       Result Value   Alcohol, Ethyl (B) 170 (*)    All other components within normal limits  CBC WITH DIFFERENTIAL/PLATELET - Abnormal; Notable for the following:    WBC 12.2 (*)  RBC 3.74 (*)    HCT 36.2 (*)    MCH 35.6 (*)    MCHC 36.7 (*)    Platelets 124 (*)    Neutro Abs 9.5 (*)    Monocytes Absolute 1.4 (*)    All other components within normal limits  COMPREHENSIVE METABOLIC PANEL - Abnormal; Notable for the following:    Sodium 125 (*)    Potassium 3.4 (*)    Chloride 90 (*)    CO2  17 (*)    Glucose, Bld 126 (*)    BUN 25 (*)    Creatinine, Ser 2.53 (*)    Calcium 7.3 (*)    AST 170 (*)    GFR calc non Af Amer 26 (*)    GFR calc Af Amer 30 (*)    Anion gap 18 (*)    All other components within normal limits  CK - Abnormal; Notable for the following:    Total CK 5,734 (*)    All other components within normal limits  RAPID URINE DRUG SCREEN, HOSP PERFORMED  PROTIME-INR  HIV ANTIBODY (ROUTINE TESTING)  MAGNESIUM  PHOSPHORUS  TSH  COMPREHENSIVE METABOLIC PANEL  CBC  CK   ____________________________________________  EKG   EKG Interpretation  Date/Time:  Thursday March 18 2017 19:35:10 EDT Ventricular Rate:  113 PR Interval:    QRS Duration: 100 QT Interval:  339 QTC Calculation: 465 R Axis:   51 Text Interpretation:  Sinus tachycardia No STEMI.  Confirmed by Nanda Quinton [phone]) on 03/18/2017 9:39:41 PM       ____________________________________________  RADIOLOGY  Dg Chest 2 View  Result Date: 03/18/2017 CLINICAL DATA:  Patient states "some SOB since I don't know how Ah Bott". Hx of HTN. Reason for exam stated as hyponatremia. EXAM: CHEST  2 VIEW COMPARISON:  None. FINDINGS: Cardiac silhouette is normal in size. No mediastinal or hilar masses. No evidence of adenopathy. Clear lungs.  No pleural effusion or pneumothorax. Skeletal structures are intact. IMPRESSION: No active cardiopulmonary disease. Electronically Signed   By: Lajean Manes M.D.   On: 03/18/2017 20:44    ____________________________________________   PROCEDURES  Procedure(s) performed:   Procedures  None ____________________________________________   INITIAL IMPRESSION / ASSESSMENT AND PLAN / ED COURSE  Pertinent labs & imaging results that were available during my care of the patient were reviewed by me and considered in my medical decision making (see chart for details).  Patient resents emergency department for evaluation of diarrhea for the last 2 weeks associated  abdominal cramping pain that is diffuse. He has developed muscle aches and cramping in addition to his abdominal discomfort. Labs from triage show creatinine of 2.5 with elevated BUN and abnormal electrolytes. Suspect that the patient has prerenal acute kidney injury likely from diarrhea.   The patient's CK is elevated to > 5000. IVF given previously. CIWA 19. Ativan 2mg  given. Plan for admission.  Discussed patient's case with Hospitalist, Dr. Marin Comment. Patient and family (if present) updated with plan. Care transferred to Hospitalist service.  I reviewed all nursing notes, vitals, pertinent old records, EKGs, labs, imaging (as available).   ____________________________________________  FINAL CLINICAL IMPRESSION(S) / ED DIAGNOSES  Final diagnoses:  AKI (acute kidney injury) ([place])  Alcohol withdrawal syndrome without complication ([facility])  Non-traumatic rhabdomyolysis     MEDICATIONS GIVEN DURING THIS VISIT:  Medications  carvedilol (COREG) tablet 6.25 mg (not administered)  LORazepam (ATIVAN) tablet 1 mg (not administered)  LORazepam (ATIVAN) tablet 1 mg (not administered)    Or  LORazepam (ATIVAN) injection 1 mg (not administered)  thiamine (VITAMIN B-1) tablet 100 mg (not administered)    Or  thiamine (B-1) injection 100 mg (not administered)  folic acid (FOLVITE) tablet 1 mg (not administered)  multivitamin with minerals tablet 1 tablet (not administered)  LORazepam (ATIVAN) injection 0-4 mg (not administered)    Followed by  LORazepam (ATIVAN) injection 0-4 mg (not administered)  sodium chloride flush (NS) 0.9 % injection 3 mL (not administered)  dextrose 5 % and 0.9 % NaCl with KCl 20 mEq/L infusion (not administered)  calcium gluconate 2 g in sodium chloride 0.9 % 100 mL IVPB (2 g Intravenous New Bag/Given 03/18/17 2042)  ondansetron (ZOFRAN-ODT) 4 MG disintegrating tablet (4 mg  Given 03/18/17 1649)  sodium chloride 0.9 % bolus 1,000 mL (0 mLs Intravenous Stopped 03/18/17 1903)    LORazepam (ATIVAN) injection 2 mg (2 mg Intravenous Given 03/18/17 1825)     NEW OUTPATIENT MEDICATIONS STARTED DURING THIS VISIT:  None   Note:  This document was prepared using Dragon voice recognition software and may include unintentional dictation errors.  Nanda Quinton, MD Emergency Medicine    Ronnika Collett, [facility], MD 03/18/17 2141

## 2017-03-19 ENCOUNTER — Inpatient Hospital Stay (HOSPITAL_COMMUNITY): Payer: BLUE CROSS/BLUE SHIELD

## 2017-03-19 DIAGNOSIS — D696 Thrombocytopenia, unspecified: Secondary | ICD-10-CM

## 2017-03-19 DIAGNOSIS — R740 Nonspecific elevation of levels of transaminase and lactic acid dehydrogenase [LDH]: Secondary | ICD-10-CM

## 2017-03-19 DIAGNOSIS — F10239 Alcohol dependence with withdrawal, unspecified: Secondary | ICD-10-CM

## 2017-03-19 DIAGNOSIS — E871 Hypo-osmolality and hyponatremia: Secondary | ICD-10-CM

## 2017-03-19 DIAGNOSIS — F1020 Alcohol dependence, uncomplicated: Secondary | ICD-10-CM

## 2017-03-19 DIAGNOSIS — R7401 Elevation of levels of liver transaminase levels: Secondary | ICD-10-CM

## 2017-03-19 DIAGNOSIS — M6282 Rhabdomyolysis: Secondary | ICD-10-CM

## 2017-03-19 LAB — COMPREHENSIVE METABOLIC PANEL
ALT: 54 U/L (ref 17–63)
AST: 156 U/L — ABNORMAL HIGH (ref 15–41)
Albumin: 3.4 g/dL — ABNORMAL LOW (ref 3.5–5.0)
Alkaline Phosphatase: 66 U/L (ref 38–126)
Anion gap: 14 (ref 5–15)
BUN: 20 mg/dL (ref 6–20)
CO2: 18 mmol/L — ABNORMAL LOW (ref 22–32)
Calcium: 7.1 mg/dL — ABNORMAL LOW (ref 8.9–10.3)
Chloride: 98 mmol/L — ABNORMAL LOW (ref 101–111)
Creatinine, Ser: 1.86 mg/dL — ABNORMAL HIGH (ref 0.61–1.24)
GFR calc Af Amer: 44 mL/min — ABNORMAL LOW (ref >60)
GFR calc non Af Amer: 38 mL/min — ABNORMAL LOW (ref >60)
Glucose, Bld: 134 mg/dL — ABNORMAL HIGH (ref 65–99)
Potassium: 3.5 mmol/L (ref 3.5–5.1)
Sodium: 130 mmol/L — ABNORMAL LOW (ref 135–145)
Total Bilirubin: 1.4 mg/dL — ABNORMAL HIGH (ref 0.3–1.2)
Total Protein: 6.7 g/dL (ref 6.5–8.1)

## 2017-03-19 LAB — CBC
HCT: 33 % — ABNORMAL LOW (ref 39.0–52.0)
Hemoglobin: 11.9 g/dL — ABNORMAL LOW (ref 13.0–17.0)
MCH: 35.5 pg — ABNORMAL HIGH (ref 26.0–34.0)
MCHC: 36.1 g/dL — ABNORMAL HIGH (ref 30.0–36.0)
MCV: 98.5 fL (ref 78.0–100.0)
Platelets: 89 10*3/uL — ABNORMAL LOW (ref 150–400)
RBC: 3.35 MIL/uL — ABNORMAL LOW (ref 4.22–5.81)
RDW: 12.7 % (ref 11.5–15.5)
WBC: 7.7 10*3/uL (ref 4.0–10.5)

## 2017-03-19 LAB — LIPASE, BLOOD: Lipase: 163 U/L — ABNORMAL HIGH (ref 11–51)

## 2017-03-19 LAB — CK: Total CK: 4994 U/L — ABNORMAL HIGH (ref 49–397)

## 2017-03-19 LAB — MAGNESIUM: Magnesium: 1.4 mg/dL — ABNORMAL LOW (ref 1.7–2.4)

## 2017-03-19 MED ORDER — SODIUM CHLORIDE 0.9 % IV SOLN
2.0000 g | Freq: Once | INTRAVENOUS | Status: AC
  Administered 2017-03-19: 2 g via INTRAVENOUS
  Filled 2017-03-19: qty 20

## 2017-03-19 MED ORDER — LORAZEPAM 2 MG/ML IJ SOLN
0.0000 mg | INTRAMUSCULAR | Status: DC
  Administered 2017-03-19 – 2017-03-21 (×12): 2 mg via INTRAVENOUS
  Administered 2017-03-22: 1 mg via INTRAVENOUS
  Administered 2017-03-22: 2 mg via INTRAVENOUS
  Administered 2017-03-22: 1 mg via INTRAVENOUS
  Filled 2017-03-19 (×15): qty 1

## 2017-03-19 MED ORDER — SODIUM CHLORIDE 0.9 % IV SOLN: 1.0000 g | Freq: Once | INTRAVENOUS | Status: DC

## 2017-03-19 MED ORDER — LORAZEPAM 2 MG/ML IJ SOLN
2.0000 mg | INTRAMUSCULAR | Status: DC | PRN
  Administered 2017-03-21 – 2017-03-22 (×3): 2 mg via INTRAVENOUS
  Filled 2017-03-19 (×4): qty 1

## 2017-03-19 MED ORDER — ONDANSETRON HCL 4 MG/2ML IJ SOLN
4.0000 mg | Freq: Four times a day (QID) | INTRAMUSCULAR | Status: DC | PRN
  Administered 2017-03-19: 4 mg via INTRAVENOUS
  Filled 2017-03-19: qty 2

## 2017-03-19 MED ORDER — PANTOPRAZOLE SODIUM 40 MG PO TBEC
40.0000 mg | DELAYED_RELEASE_TABLET | Freq: Every day | ORAL | Status: DC
  Administered 2017-03-19 – 2017-03-22 (×4): 40 mg via ORAL
  Filled 2017-03-19 (×4): qty 1

## 2017-03-19 MED ORDER — LORAZEPAM 2 MG/ML IJ SOLN: 0.0000 mg | Freq: Four times a day (QID) | INTRAMUSCULAR | Status: DC

## 2017-03-19 NOTE — Progress Notes (Addendum)
PROGRESS NOTE  Chris Flynn BJY:[id number] DOB: 12/10/1955 DOA: 03/18/2017 PCP: Celene Squibb, MD  Brief History:  61 year old male with a history of essential hypertension, alcohol abuse, PTSD presented with one-week history of leg cramps as well as cramps in his shoulders and arms. The patient drinks 1/2 pint of vodka on a daily basis for the past year. He denies any recent injuries or trauma. He denies any new medications or illicit drugs. His last drink was at noon time on 03/18/2017. He states that he had approximately 20 episodes of vomiting on 03/18/2017, but has not had any emesis since that period of time. In addition, he has had a week history of loose stools which appears to have improved within the past 24 hours. He denies any hematemesis, hematochezia, melena, vomiting, dysuria, hematuria, fevers, chills, chest pain, shortness breath. In the emergency department, workup revealed WBC 12.2 with CPK 5734. Serum creatinine was 2.53. Alcohol level was 170.  Assessment/Plan: Acute kidney injury -Secondary to volume depletion due to GI losses -Continue IV fluids -Renal ultrasound -holding losartan/HCTZ  Rhabdomyolysis -Alcohol-induced -Presenting CPK 5734>>> 4994 -Continue IV fluids -Check phosphorus--4.3 -TSH 2.528  Hypomagnesemia/hypokalemia -Replete  Hyponatremia -Secondary to volume depletion and impaired renal rubs options secondary to AKI -Improving with IV fluids  Alcohol abuse and alcohol withdrawal -Cessation discussed -Alcohol withdrawal protocol  Thrombocytopenia -Secondary to chronic alcohol use -Check serum B12 -Check HIV  Essential hypertension -Continue carvedilol  Hypocalcemia -replete -check 25-vitamin D  Transaminasemia -due to Etoh -Abd [place]  Disposition Plan:   Home in 2-3 days  Family Communication:   No Family at bedside--Total time spent 35 minutes.  Greater than 50% spent face to face counseling and coordinating  care.   Consultants:  none  Code Status:  FULL  DVT Prophylaxis:  SCDs   Procedures: As Listed in Progress Note Above  Antibiotics: None    Subjective: Patient denies fevers, chills, headache, chest pain, dyspnea, nausea, vomiting, diarrhea, abdominal pain, dysuria, hematuria, hematochezia, and melena. He states diarrhea and vomiting are better   Objective: Vitals:   03/18/17 2015 03/18/17 2030 03/18/17 2123 03/19/17 0400  BP:   (!) 160/82 137/62  Pulse: (!) 112  (!) 120 (!) 109  Resp: 18 20  18   Temp:   98.7 F (37.1 C) 99.1 F (37.3 C)  TempSrc:   Oral Oral  SpO2: 95%  98% 98%  Weight:   96.7 kg (213 lb 3 oz)   Height:   5\' 8"  (1.727 m)     Intake/Output Summary (Last 24 hours) at 03/19/17 0865 Last data filed at 03/19/17 0700  Gross per 24 hour  Intake          1545.83 ml  Output             1450 ml  Net            95.83 ml   Weight change:  Exam:   General:  Pt is alert, follows commands appropriately, not in acute distress  HEENT: No icterus, No thrush, No neck mass, [place]/AT  Cardiovascular: RRR, S1/S2, no rubs, no gallops  Respiratory: CTA bilaterally, no wheezing, no crackles, no rhonchi  Abdomen: Soft/+BS, non tender, non distended, no guarding  Extremities: No edema, No lymphangitis, No petechiae, No rashes, no synovitis   Data Reviewed: I have personally reviewed following labs and imaging studies Basic Metabolic Panel:  Recent Labs Lab 03/18/17 1626 03/18/17 2112 03/19/17  0512  NA 125*  --  130*  K 3.4*  --  3.5  CL 90*  --  98*  CO2 17*  --  18*  GLUCOSE 126*  --  134*  BUN 25*  --  20  CREATININE 2.53*  --  1.86*  CALCIUM 7.3*  --  7.1*  MG  --  0.6*  --   PHOS  --  4.3  --    Liver Function Tests:  Recent Labs Lab 03/18/17 1626 03/19/17 0512  AST 170* 156*  ALT 62 54  ALKPHOS 77 66  BILITOT 1.0 1.4*  PROT 7.8 6.7  ALBUMIN 4.1 3.4*   No results for input(s): LIPASE, AMYLASE in the last 168 hours. No results for  input(s): AMMONIA in the last 168 hours. Coagulation Profile:  Recent Labs Lab 03/18/17 1626  INR 1.02   CBC:  Recent Labs Lab 03/18/17 1626 03/19/17 0512  WBC 12.2* 7.7  NEUTROABS 9.5*  --   HGB 13.3 11.9*  HCT 36.2* 33.0*  MCV 96.8 98.5  PLT 124* 89*   Cardiac Enzymes:  Recent Labs Lab 03/18/17 1626 03/19/17 0512  CKTOTAL 5,734* 4,994*   BNP: Invalid input(s): POCBNP CBG: No results for input(s): GLUCAP in the last 168 hours. HbA1C: No results for input(s): HGBA1C in the last 72 hours. Urine analysis: No results found for: COLORURINE, APPEARANCEUR, LABSPEC, PHURINE, GLUCOSEU, HGBUR, BILIRUBINUR, KETONESUR, PROTEINUR, UROBILINOGEN, NITRITE, LEUKOCYTESUR Sepsis Labs: @LABRCNTIP (procalcitonin:4,lacticidven:4) )No results found for this or any previous visit (from the past 240 hour(s)).   Scheduled Meds: . carvedilol  6.25 mg Oral BID WC  . folic acid  1 mg Oral Daily  . LORazepam  0-4 mg Intravenous Q4H   Followed by  . [START ON 03/22/2017] LORazepam  0-4 mg Intravenous Q6H  . multivitamin with minerals  1 tablet Oral Daily  . sodium chloride flush  3 mL Intravenous Q12H  . thiamine  100 mg Oral Daily   Or  . thiamine  100 mg Intravenous Daily   Continuous Infusions: . dextrose 5 % and 0.9 % NaCl with KCl 20 mEq/L 125 mL/hr at 03/19/17 8182    Procedures/Studies: Dg Chest 2 View  Result Date: 03/18/2017 CLINICAL DATA:  Patient states "some SOB since I don't know how long". Hx of HTN. Reason for exam stated as hyponatremia. EXAM: CHEST  2 VIEW COMPARISON:  None. FINDINGS: Cardiac silhouette is normal in size. No mediastinal or hilar masses. No evidence of adenopathy. Clear lungs.  No pleural effusion or pneumothorax. Skeletal structures are intact. IMPRESSION: No active cardiopulmonary disease. Electronically Signed   By: Lajean Manes M.D.   On: 03/18/2017 20:44    Kimmy Totten, DO  [facility] Pager [phone]  If 7PM-7AM, please contact  night-coverage [email or web address] Password [email or web address] 03/19/2017, 8:28 AM   LOS: 1 day

## 2017-03-20 DIAGNOSIS — K852 Alcohol induced acute pancreatitis without necrosis or infection: Secondary | ICD-10-CM

## 2017-03-20 LAB — COMPREHENSIVE METABOLIC PANEL WITH GFR
ALT: 51 U/L (ref 17–63)
AST: 122 U/L — ABNORMAL HIGH (ref 15–41)
Albumin: 3.2 g/dL — ABNORMAL LOW (ref 3.5–5.0)
Alkaline Phosphatase: 63 U/L (ref 38–126)
Anion gap: 11 (ref 5–15)
BUN: 12 mg/dL (ref 6–20)
CO2: 22 mmol/L (ref 22–32)
Calcium: 7.1 mg/dL — ABNORMAL LOW (ref 8.9–10.3)
Chloride: 109 mmol/L (ref 101–111)
Creatinine, Ser: 1.53 mg/dL — ABNORMAL HIGH (ref 0.61–1.24)
GFR calc Af Amer: 55 mL/min — ABNORMAL LOW
GFR calc non Af Amer: 48 mL/min — ABNORMAL LOW
Glucose, Bld: 112 mg/dL — ABNORMAL HIGH (ref 65–99)
Potassium: 3.7 mmol/L (ref 3.5–5.1)
Sodium: 142 mmol/L (ref 135–145)
Total Bilirubin: 0.9 mg/dL (ref 0.3–1.2)
Total Protein: 6.4 g/dL — ABNORMAL LOW (ref 6.5–8.1)

## 2017-03-20 LAB — CBC
HCT: 30.7 % — ABNORMAL LOW (ref 39.0–52.0)
Hemoglobin: 10.9 g/dL — ABNORMAL LOW (ref 13.0–17.0)
MCH: 36.1 pg — ABNORMAL HIGH (ref 26.0–34.0)
MCHC: 35.5 g/dL (ref 30.0–36.0)
MCV: 101.7 fL — ABNORMAL HIGH (ref 78.0–100.0)
Platelets: 82 10*3/uL — ABNORMAL LOW (ref 150–400)
RBC: 3.02 MIL/uL — ABNORMAL LOW (ref 4.22–5.81)
RDW: 13 % (ref 11.5–15.5)
WBC: 5 10*3/uL (ref 4.0–10.5)

## 2017-03-20 LAB — C DIFFICILE QUICK SCREEN W PCR REFLEX
C Diff antigen: NEGATIVE
C Diff interpretation: NOT DETECTED
C Diff toxin: NEGATIVE

## 2017-03-20 LAB — HIV ANTIBODY (ROUTINE TESTING W REFLEX): HIV Screen 4th Generation wRfx: NONREACTIVE

## 2017-03-20 LAB — CK: Total CK: 2856 U/L — ABNORMAL HIGH (ref 49–397)

## 2017-03-20 LAB — VITAMIN B12: Vitamin B-12: 494 pg/mL (ref 180–914)

## 2017-03-20 MED ORDER — SODIUM CHLORIDE 0.9 % IV SOLN
2.0000 g | Freq: Once | INTRAVENOUS | Status: AC
  Administered 2017-03-20: 2 g via INTRAVENOUS
  Filled 2017-03-20: qty 20

## 2017-03-20 NOTE — Progress Notes (Signed)
PROGRESS NOTE  Chris Flynn ZOX:[id number] DOB: 12-22-1955 DOA: 03/18/2017 PCP: Celene Squibb, MD  Brief History:  61 year old male with a history of essential hypertension, alcohol abuse, PTSD presented with one-week history of leg cramps as well as cramps in his shoulders and arms. The patient drinks 1/2 pint of vodka on a daily basis for the past year. He denies any recent injuries or trauma. He denies any new medications or illicit drugs. His last drink was at noon time on 03/18/2017. He states that he had approximately 20 episodes of vomiting on 03/18/2017, but has not had any emesis since that period of time. In addition, he has had a week history of loose stools which appears to have improved within the past 24 hours. He denies any hematemesis, hematochezia, melena, vomiting, dysuria, hematuria, fevers, chills, chest pain, shortness breath. In the emergency department, workup revealed WBC 12.2 with CPK 5734. Serum creatinine was 2.53. Alcohol level was 170.  Assessment/Plan: Acute Pancreatitis -secondary to Etoh -presented with N/V, abd pain, lipase 156 -check lipids -abd US--echogenic liver parenchyma, moderate renal atrophy, no cholelithiasis -Continue liquid diet  Acute kidney injury -Secondary to volume depletion due to GI losses -Continue IV fluids-->improving -Renal ultrasound--negative for hydronephrosis, mild right renal atrophy -holding losartan/HCTZ  Rhabdomyolysis -Alcohol-induced -Presenting CPK 5734>>> [phone] -Continue IV fluids -Check phosphorus--4.3 -TSH 2.528  Diarrhea -check C diff -check stool pathogen panel  Hypomagnesemia/hypokalemia -Repleted  Hyponatremia -Secondary to volume depletion and impaired renal rubs options secondary to AKI -Improving with IV fluids  Alcohol abuse and alcohol withdrawal -Cessation discussed -Alcohol withdrawal protocol  Thrombocytopenia -Secondary to chronic alcohol use -Check serum  B12--494 -Check HIV--neg  Essential hypertension -Continue carvedilol  Hypocalcemia -replete--give additional calcium gluconate -check 25-vitamin D--pending  Transaminasemia -due to Etoh -Abd US--as above  Disposition Plan:   Home in 2-3 days  Family Communication:   Spouse updated at bedside 7/21--Total time spent 35 minutes.  Greater than 50% spent face to face counseling and coordinating care.   Consultants:  none  Code Status:  FULL  DVT Prophylaxis:  SCDs   Procedures: As Listed in Progress Note Above  Antibiotics: None    Subjective: Patient complains of loose stools 4 episodes of hematochezia. He states that his nausea and vomiting are improving. He has not had any emesis since yesterday. There is no hematochezia or melena. He denies any abdominal pain. There is no fevers, chills, chest pain, shortness breath, cough, hemoptysis, dysuria, hematuria.  Objective: Vitals:   03/20/17 0001 03/20/17 0629 03/20/17 0732 03/20/17 1236  BP: 128/72 124/66 129/71 (!) 161/85  Pulse: 78 81 75 94  Resp: 18 18 18 18   Temp: 97.8 F (36.6 C) 98.2 F (36.8 C) 98.2 F (36.8 C) 97.9 F (36.6 C)  TempSrc: Oral Oral Oral Oral  SpO2: 98% 99% 98% 100%  Weight:      Height:        Intake/Output Summary (Last 24 hours) at 03/20/17 1420 Last data filed at 03/20/17 1300  Gross per 24 hour  Intake             2860 ml  Output              553 ml  Net             2307 ml   Weight change:  Exam:   General:  Pt is alert, follows commands appropriately, not in acute distress  HEENT: No  icterus, No thrush, No neck mass, [place]/AT  Cardiovascular: RRR, S1/S2, no rubs, no gallops  Respiratory: CTA bilaterally, no wheezing, no crackles, no rhonchi  Abdomen: Soft/+BS, non tender, non distended, no guarding  Extremities: No edema, No lymphangitis, No petechiae, No rashes, no synovitis   Data Reviewed: I have personally reviewed following labs and imaging  studies Basic Metabolic Panel:  Recent Labs Lab 03/18/17 1626 03/18/17 2112 03/19/17 0512 03/20/17 0705  NA 125*  --  130* 142  K 3.4*  --  3.5 3.7  CL 90*  --  98* 109  CO2 17*  --  18* 22  GLUCOSE 126*  --  134* 112*  BUN 25*  --  20 12  CREATININE 2.53*  --  1.86* 1.53*  CALCIUM 7.3*  --  7.1* 7.1*  MG  --  0.6* 1.4*  --   PHOS  --  4.3  --   --    Liver Function Tests:  Recent Labs Lab 03/18/17 1626 03/19/17 0512 03/20/17 0705  AST 170* 156* 122*  ALT 62 54 51  ALKPHOS 77 66 63  BILITOT 1.0 1.4* 0.9  PROT 7.8 6.7 6.4*  ALBUMIN 4.1 3.4* 3.2*    Recent Labs Lab 03/19/17 0512  LIPASE 163*   No results for input(s): AMMONIA in the last 168 hours. Coagulation Profile:  Recent Labs Lab 03/18/17 1626  INR 1.02   CBC:  Recent Labs Lab 03/18/17 1626 03/19/17 0512 03/20/17 0705  WBC 12.2* 7.7 5.0  NEUTROABS 9.5*  --   --   HGB 13.3 11.9* 10.9*  HCT 36.2* 33.0* 30.7*  MCV 96.8 98.5 101.7*  PLT 124* 89* 82*   Cardiac Enzymes:  Recent Labs Lab 03/18/17 1626 03/19/17 0512 03/20/17 0705  CKTOTAL 5,734* 4,994* 2,856*   BNP: Invalid input(s): POCBNP CBG: No results for input(s): GLUCAP in the last 168 hours. HbA1C: No results for input(s): HGBA1C in the last 72 hours. Urine analysis: No results found for: COLORURINE, APPEARANCEUR, LABSPEC, PHURINE, GLUCOSEU, HGBUR, BILIRUBINUR, KETONESUR, PROTEINUR, UROBILINOGEN, NITRITE, LEUKOCYTESUR Sepsis Labs: @LABRCNTIP (procalcitonin:4,lacticidven:4) )No results found for this or any previous visit (from the past 240 hour(s)).   Scheduled Meds: . carvedilol  6.25 mg Oral BID WC  . folic acid  1 mg Oral Daily  . LORazepam  0-4 mg Intravenous Q4H   Followed by  . [START ON 03/22/2017] LORazepam  0-4 mg Intravenous Q6H  . multivitamin with minerals  1 tablet Oral Daily  . pantoprazole  40 mg Oral Daily  . sodium chloride flush  3 mL Intravenous Q12H  . thiamine  100 mg Oral Daily   Or  . thiamine  100  mg Intravenous Daily   Continuous Infusions: . dextrose 5 % and 0.9 % NaCl with KCl 20 mEq/L 125 mL/hr at 03/20/17 7124    Procedures/Studies: Dg Chest 2 View  Result Date: 03/18/2017 CLINICAL DATA:  Patient states "some SOB since I don't know how long". Hx of HTN. Reason for exam stated as hyponatremia. EXAM: CHEST  2 VIEW COMPARISON:  None. FINDINGS: Cardiac silhouette is normal in size. No mediastinal or hilar masses. No evidence of adenopathy. Clear lungs.  No pleural effusion or pneumothorax. Skeletal structures are intact. IMPRESSION: No active cardiopulmonary disease. Electronically Signed   By: Lajean Manes M.D.   On: 03/18/2017 20:44   US Abdomen Complete  Result Date: 03/19/2017 CLINICAL DATA:  Elevated transaminases. EXAM: ABDOMEN ULTRASOUND COMPLETE COMPARISON:  No recent prior . FINDINGS: Gallbladder: No gallstones or  wall thickening visualized. No sonographic Murphy sign noted by sonographer. Common bile duct: Diameter: 6.5 mm Liver: Increased hepatic echogenicity consistent fatty infiltration and/or hepatocellular disease. No focal hepatic abnormality identified. IVC: No abnormality visualized. Pancreas: Not visualized. Spleen: Size and appearance within normal limits. Right Kidney: Length: 9.9 cm. Echogenicity within normal limits. No mass or hydronephrosis visualized. Left Kidney: Length: 12.6 cm. Echogenicity within normal limits. No mass or hydronephrosis visualized. Abdominal aorta: No aneurysm visualized. Other findings: Limited exam due to patient's body habitus and overlying bowel gas. IMPRESSION: 1. Increased hepatic echogenicity consistent with fatty infiltration and/or hepatocellular disease. No focal hepatic abnormality identified. No gallstones or biliary distention. 2. Mild right renal atrophy cannot be excluded. Exam limited due to patient's body habitus and overlying bowel gas. Electronically Signed   By: Marcello Moores  Register   On: 03/19/2017 10:00    Hasson Gaspard,  DO  [facility] Pager [phone]  If 7PM-7AM, please contact night-coverage [email or web address] Password [email or web address] 03/20/2017, 2:20 PM   LOS: 2 days

## 2017-03-21 LAB — GASTROINTESTINAL PANEL BY PCR, STOOL (REPLACES STOOL CULTURE)

## 2017-03-21 LAB — COMPREHENSIVE METABOLIC PANEL
ALT: 52 U/L (ref 17–63)
AST: 107 U/L — ABNORMAL HIGH (ref 15–41)
Albumin: 3.2 g/dL — ABNORMAL LOW (ref 3.5–5.0)
Alkaline Phosphatase: 59 U/L (ref 38–126)
Anion gap: 8 (ref 5–15)
BUN: 7 mg/dL (ref 6–20)
CO2: 20 mmol/L — ABNORMAL LOW (ref 22–32)
Calcium: 7 mg/dL — ABNORMAL LOW (ref 8.9–10.3)
Chloride: 110 mmol/L (ref 101–111)
Creatinine, Ser: 1.18 mg/dL (ref 0.61–1.24)
GFR calc Af Amer: >60 mL/min (ref >60)
GFR calc non Af Amer: >60 mL/min (ref >60)
Glucose, Bld: 105 mg/dL — ABNORMAL HIGH (ref 65–99)
Potassium: 3.9 mmol/L (ref 3.5–5.1)
Sodium: 138 mmol/L (ref 135–145)
Total Bilirubin: 0.7 mg/dL (ref 0.3–1.2)
Total Protein: 6.5 g/dL (ref 6.5–8.1)

## 2017-03-21 LAB — CBC
HCT: 31 % — ABNORMAL LOW (ref 39.0–52.0)
Hemoglobin: 10.8 g/dL — ABNORMAL LOW (ref 13.0–17.0)
MCH: 35.4 pg — ABNORMAL HIGH (ref 26.0–34.0)
MCHC: 34.8 g/dL (ref 30.0–36.0)
MCV: 101.6 fL — ABNORMAL HIGH (ref 78.0–100.0)
Platelets: 100 10*3/uL — ABNORMAL LOW (ref 150–400)
RBC: 3.05 MIL/uL — ABNORMAL LOW (ref 4.22–5.81)
RDW: 13 % (ref 11.5–15.5)
WBC: 5.2 10*3/uL (ref 4.0–10.5)

## 2017-03-21 LAB — LIPID PANEL
Cholesterol: 151 mg/dL (ref 0–200)
HDL: 47 mg/dL (ref >40)
LDL Cholesterol: 78 mg/dL (ref 0–99)
Total CHOL/HDL Ratio: 3.2 RATIO
Triglycerides: 129 mg/dL (ref <150)
VLDL: 26 mg/dL (ref 0–40)

## 2017-03-21 LAB — CK: Total CK: 1620 U/L — ABNORMAL HIGH (ref 49–397)

## 2017-03-21 LAB — MAGNESIUM: Magnesium: 0.7 mg/dL — CL (ref 1.7–2.4)

## 2017-03-21 MED ORDER — MAGNESIUM SULFATE 4 GM/100ML IV SOLN
4.0000 g | Freq: Once | INTRAVENOUS | Status: AC
  Administered 2017-03-21: 4 g via INTRAVENOUS
  Filled 2017-03-21: qty 100

## 2017-03-21 MED ORDER — CALCIUM CARBONATE 1250 (500 CA) MG PO TABS
1.0000 | ORAL_TABLET | Freq: Two times a day (BID) | ORAL | Status: DC
  Administered 2017-03-21 – 2017-03-22 (×2): 500 mg via ORAL
  Filled 2017-03-21 (×2): qty 1

## 2017-03-21 MED ORDER — SODIUM CHLORIDE 0.9 % IV SOLN
2.0000 g | Freq: Once | INTRAVENOUS | Status: AC
  Administered 2017-03-21: 2 g via INTRAVENOUS
  Filled 2017-03-21: qty 20

## 2017-03-21 NOTE — Progress Notes (Signed)
PROGRESS NOTE  Chris Flynn OVZ:[id number] DOB: [birth year] DOA: 03/18/2017 PCP: Celene Squibb, MD  Brief History: 61 year old male with a history of essential hypertension, alcohol abuse, PTSD presented with one-week history of leg cramps as well as cramps in his shoulders and arms. The patient drinks 1/2 pint of vodka on a daily basis for the past year. He denies any recent injuries or trauma. He denies any new medications or illicit drugs. His last drink was at noon time on 03/18/2017. He states that he had approximately 20 episodes of vomiting on 03/18/2017, but has not had any emesis since that period of time. In addition, he has had a week history of loose stools which appears to have improved within the past 24 hours. He denies any hematemesis, hematochezia, melena, vomiting, dysuria, hematuria, fevers, chills, chest pain, shortness breath. In the emergency department, workup revealed WBC 12.2 with CPK 5734. Serum creatinine was 2.53. Alcohol level was 170.  Assessment/Plan: Acute Pancreatitis -secondary to Etoh -presented with N/V, abd pain, lipase 156 -check lipids--LDL 78, Trig 129 -abd US--echogenic liver parenchyma, moderate renal atrophy, no cholelithiasis -advance to full liquids  Acute kidney injury -Secondary to volume depletion due to GI losses -Continue IV fluids-->improving -Renal ultrasound--negative for hydronephrosis, mild right renal atrophy -holding losartan/HCTZ  Rhabdomyolysis -Alcohol-induced -Presenting CPK 5734>>> [phone] -Continue IV fluids -Check phosphorus--4.3 -TSH 2.528  Diarrhea -check C diff--negative -check stool pathogen panel--pending  Hypomagnesemia/hypokalemia -Repleted  Hyponatremia -Secondary to volume depletion and impaired renal rubs options secondary to AKI -Improving with IV fluids  Alcohol abuse and alcohol withdrawal -Cessation discussed -Alcohol withdrawal protocol  Thrombocytopenia -Secondary to  chronic alcohol use -Check serum B12--494 -Check HIV--neg  Essential hypertension -Continue carvedilol  Hypocalcemia -replete--give additional calcium gluconate 7/22--2 grams -check 25-vitamin D--pending -start calcium supplement po bid  Transaminasemia -due to Etoh -Abd US--as above -improving  Disposition Plan: Home 7/23 if stable Family Communication: Spouse updated at bedside 7/21--Total time spent 35 minutes. Greater than 50% spent face to face counseling and coordinating care.   Consultants: none  Code Status: FULL  DVT Prophylaxis: SCDs   Procedures: As Listed in Progress Note Above  Antibiotics: None   Subjective: Patient denies fevers, chills, headache, chest pain, dyspnea, nausea, vomiting, diarrhea, abdominal pain, dysuria, hematuria, hematochezia, and melena. He states that his leg cramps and arm cramps are much improved.  Objective: Vitals:   03/20/17 2140 03/21/17 0607 03/21/17 0736 03/21/17 1300  BP: 132/65 129/80 (!) 143/77 137/72  Pulse: 100 81 83 72  Resp: 18 18 18 18   Temp: 98.1 F (36.7 C) 98.4 F (36.9 C) 98.5 F (36.9 C) 98.3 F (36.8 C)  TempSrc: Oral Oral Oral Oral  SpO2: 100% 98% 99% 97%  Weight:      Height:        Intake/Output Summary (Last 24 hours) at 03/21/17 1456 Last data filed at 03/21/17 1200  Gross per 24 hour  Intake             2715 ml  Output              302 ml  Net             2413 ml   Weight change:  Exam:   General:  Pt is alert, follows commands appropriately, not in acute distress  HEENT: No icterus, No thrush, No neck mass, [place]/AT  Cardiovascular: RRR, S1/S2, no rubs, no gallops  Respiratory: CTA bilaterally,  no wheezing, no crackles, no rhonchi  Abdomen: Soft/+BS, non tender, non distended, no guarding  Extremities: No edema, No lymphangitis, No petechiae, No rashes, no synovitis   Data Reviewed: I have personally reviewed following labs and imaging studies Basic  Metabolic Panel:  Recent Labs Lab 03/18/17 1626 03/18/17 2112 03/19/17 0512 03/20/17 0705 03/21/17 0656  NA 125*  --  130* 142 138  K 3.4*  --  3.5 3.7 3.9  CL 90*  --  98* 109 110  CO2 17*  --  18* 22 20*  GLUCOSE 126*  --  134* 112* 105*  BUN 25*  --  20 12 7   CREATININE 2.53*  --  1.86* 1.53* 1.18  CALCIUM 7.3*  --  7.1* 7.1* 7.0*  MG  --  0.6* 1.4*  --  0.7*  PHOS  --  4.3  --   --   --    Liver Function Tests:  Recent Labs Lab 03/18/17 1626 03/19/17 0512 03/20/17 0705 03/21/17 0656  AST 170* 156* 122* 107*  ALT 62 54 51 52  ALKPHOS 77 66 63 59  BILITOT 1.0 1.4* 0.9 0.7  PROT 7.8 6.7 6.4* 6.5  ALBUMIN 4.1 3.4* 3.2* 3.2*    Recent Labs Lab 03/19/17 0512  LIPASE 163*   No results for input(s): AMMONIA in the last 168 hours. Coagulation Profile:  Recent Labs Lab 03/18/17 1626  INR 1.02   CBC:  Recent Labs Lab 03/18/17 1626 03/19/17 0512 03/20/17 0705 03/21/17 0656  WBC 12.2* 7.7 5.0 5.2  NEUTROABS 9.5*  --   --   --   HGB 13.3 11.9* 10.9* 10.8*  HCT 36.2* 33.0* 30.7* 31.0*  MCV 96.8 98.5 101.7* 101.6*  PLT 124* 89* 82* 100*   Cardiac Enzymes:  Recent Labs Lab 03/18/17 1626 03/19/17 0512 03/20/17 0705 03/21/17 0656  CKTOTAL 5,734* 4,994* 2,856* 1,620*   BNP: Invalid input(s): POCBNP CBG: No results for input(s): GLUCAP in the last 168 hours. HbA1C: No results for input(s): HGBA1C in the last 72 hours. Urine analysis: No results found for: COLORURINE, APPEARANCEUR, LABSPEC, PHURINE, GLUCOSEU, HGBUR, BILIRUBINUR, KETONESUR, PROTEINUR, UROBILINOGEN, NITRITE, LEUKOCYTESUR Sepsis Labs: @LABRCNTIP (procalcitonin:4,lacticidven:4) ) Recent Results (from the past 240 hour(s))  C difficile quick scan w PCR reflex     Status: None   Collection Time: 03/20/17  1:21 PM  Result Value Ref Range Status   C Diff antigen NEGATIVE NEGATIVE Final   C Diff toxin NEGATIVE NEGATIVE Final   C Diff interpretation No C. difficile detected.  Final      Scheduled Meds: . carvedilol  6.25 mg Oral BID WC  . folic acid  1 mg Oral Daily  . LORazepam  0-4 mg Intravenous Q4H   Followed by  . [START ON 03/22/2017] LORazepam  0-4 mg Intravenous Q6H  . multivitamin with minerals  1 tablet Oral Daily  . pantoprazole  40 mg Oral Daily  . sodium chloride flush  3 mL Intravenous Q12H  . thiamine  100 mg Oral Daily   Or  . thiamine  100 mg Intravenous Daily   Continuous Infusions: . dextrose 5 % and 0.9 % NaCl with KCl 20 mEq/L 125 mL/hr at 03/21/17 0801    Procedures/Studies: Dg Chest 2 View  Result Date: 03/18/2017 CLINICAL DATA:  Patient states "some SOB since I don't know how long". Hx of HTN. Reason for exam stated as hyponatremia. EXAM: CHEST  2 VIEW COMPARISON:  None. FINDINGS: Cardiac silhouette is normal in size. No mediastinal  or hilar masses. No evidence of adenopathy. Clear lungs.  No pleural effusion or pneumothorax. Skeletal structures are intact. IMPRESSION: No active cardiopulmonary disease. Electronically Signed   By: Lajean Manes M.D.   On: 03/18/2017 20:44   US Abdomen Complete  Result Date: 03/19/2017 CLINICAL DATA:  Elevated transaminases. EXAM: ABDOMEN ULTRASOUND COMPLETE COMPARISON:  No recent prior . FINDINGS: Gallbladder: No gallstones or wall thickening visualized. No sonographic Murphy sign noted by sonographer. Common bile duct: Diameter: 6.5 mm Liver: Increased hepatic echogenicity consistent fatty infiltration and/or hepatocellular disease. No focal hepatic abnormality identified. IVC: No abnormality visualized. Pancreas: Not visualized. Spleen: Size and appearance within normal limits. Right Kidney: Length: 9.9 cm. Echogenicity within normal limits. No mass or hydronephrosis visualized. Left Kidney: Length: 12.6 cm. Echogenicity within normal limits. No mass or hydronephrosis visualized. Abdominal aorta: No aneurysm visualized. Other findings: Limited exam due to patient's body habitus and overlying bowel gas.  IMPRESSION: 1. Increased hepatic echogenicity consistent with fatty infiltration and/or hepatocellular disease. No focal hepatic abnormality identified. No gallstones or biliary distention. 2. Mild right renal atrophy cannot be excluded. Exam limited due to patient's body habitus and overlying bowel gas. Electronically Signed   By: Marcello Moores  Register   On: 03/19/2017 10:00    Madalyn Legner, DO  [facility] Pager [phone]  If 7PM-7AM, please contact night-coverage [email or web address] Password [email or web address] 03/21/2017, 2:56 PM   LOS: 3 days

## 2017-03-21 NOTE — Progress Notes (Signed)
Patient woke up and was ready to go home.  Patient stated he felt better and that he would be okay.  Patient voiced concerns about his employment. Patient still has had several bowel movements, has not complained of pain.  Advised patient to wait for MD and have a discussion probable discharge.  Patient called his spouse, and spouse stated to staff that she has mobility issues and did not want patient at home until he was well.  Patient agreeable to wait for MD to come to discuss plan.

## 2017-03-21 NOTE — Progress Notes (Signed)
CRITICAL VALUE ALERT  Critical Value: Mag 0.7  Date & Time Notied:  03/21/2017 0846  Provider Notified: Tat, MD  Orders Received/Actions taken: Awaiting further instructions

## 2017-03-22 LAB — COMPREHENSIVE METABOLIC PANEL
ALT: 47 U/L (ref 17–63)
AST: 79 U/L — ABNORMAL HIGH (ref 15–41)
Albumin: 3.2 g/dL — ABNORMAL LOW (ref 3.5–5.0)
Alkaline Phosphatase: 57 U/L (ref 38–126)
Anion gap: 8 (ref 5–15)
BUN: 5 mg/dL — ABNORMAL LOW (ref 6–20)
CO2: 19 mmol/L — ABNORMAL LOW (ref 22–32)
Calcium: 7 mg/dL — ABNORMAL LOW (ref 8.9–10.3)
Chloride: 110 mmol/L (ref 101–111)
Creatinine, Ser: 1.06 mg/dL (ref 0.61–1.24)
GFR calc Af Amer: >60 mL/min (ref >60)
GFR calc non Af Amer: >60 mL/min (ref >60)
Glucose, Bld: 108 mg/dL — ABNORMAL HIGH (ref 65–99)
Potassium: 4.3 mmol/L (ref 3.5–5.1)
Sodium: 137 mmol/L (ref 135–145)
Total Bilirubin: 0.7 mg/dL (ref 0.3–1.2)
Total Protein: 6.5 g/dL (ref 6.5–8.1)

## 2017-03-22 LAB — MAGNESIUM: Magnesium: 1 mg/dL — ABNORMAL LOW (ref 1.7–2.4)

## 2017-03-22 LAB — CK: Total CK: 830 U/L — ABNORMAL HIGH (ref 49–397)

## 2017-03-22 MED ORDER — MAGNESIUM OXIDE 400 (241.3 MG) MG PO TABS
400.0000 mg | ORAL_TABLET | Freq: Every day | ORAL | Status: DC
  Administered 2017-03-22: 400 mg via ORAL
  Filled 2017-03-22: qty 1

## 2017-03-22 MED ORDER — MAGNESIUM OXIDE 400 (241.3 MG) MG PO TABS: 400.0000 mg | ORAL_TABLET | Freq: Every day | ORAL | 0 refills | Status: DC

## 2017-03-22 MED ORDER — CALCIUM CARBONATE 1250 (500 CA) MG PO TABS: 1.0000 | ORAL_TABLET | Freq: Two times a day (BID) | ORAL | 0 refills | Status: DC

## 2017-03-22 MED ORDER — MAGNESIUM SULFATE 50 % IJ SOLN
Freq: Once | INTRAVENOUS | Status: AC
  Administered 2017-03-22: 12:00:00 via INTRAVENOUS
  Filled 2017-03-22: qty 50

## 2017-03-22 MED ORDER — CARVEDILOL 12.5 MG PO TABS: 12.5000 mg | ORAL_TABLET | Freq: Two times a day (BID) | ORAL | 0 refills | Status: DC

## 2017-03-22 MED ORDER — MAGNESIUM SULFATE 2 GM/50ML IV SOLN: 2.0000 g | Freq: Once | INTRAVENOUS | Status: DC

## 2017-03-22 NOTE — Progress Notes (Signed)
IV and Telemetry removed. Patient given discharge instructions, verbalized understanding. Awaiting transportation.

## 2017-03-22 NOTE — Care Management Note (Signed)
Case Management Note  Patient Details  Name: Chris Flynn MRN: [id number] Date of Birth: 09-23-1955  Subjective/Objective:                  Pt admitted with AKI. From home with wife. Ind with ADL's. Has PCP, transportation and insurance with drug coverage. No CM needs identified prior to DC.   Action/Plan: Discharging home today.   Expected Discharge Date:  03/22/17               Expected Discharge Plan:  Home/Self Care  In-House Referral:  NA  Discharge planning Services  CM Consult  Post Acute Care Choice:  NA Choice offered to:  NA  Status of Service:  Completed, signed off  Sherald Barge, RN 03/22/2017, 12:56 PM

## 2017-03-22 NOTE — Discharge Summary (Signed)
Physician Discharge Summary  Chris Flynn LAG:[id number] DOB: Jan 19, 1956 DOA: 03/18/2017  PCP: Celene Squibb, MD  Admit date: 03/18/2017 Discharge date: 03/22/2017  Admitted From: Home Disposition:  Home   Recommendations for Outpatient Follow-up:  1. Follow up with PCP in 1-2 weeks 2. Please obtain BMP/CBC in one week   Discharge Condition: Stable CODE STATUS: FULL Diet recommendation: Heart Healthy   Brief/Interim Summary: 61 year old male with a history of essential hypertension, alcohol abuse, PTSD presented with one-week history of leg cramps as well as cramps in his shoulders and arms. The patient drinks 1/2 pint of vodka on a daily basis for the past year. He denies any recent injuries or trauma. He denies any new medications or illicit drugs. His last drink was at noon time on 03/18/2017. He states that he had approximately 20 episodes of vomiting on 03/18/2017, but has not had any emesis since that period of time. In addition, he has had a week history of loose stools which appears to have improved within the past 24 hours. He denies any hematemesis, hematochezia, melena, vomiting, dysuria, hematuria, fevers, chills, chest pain, shortness breath. In the emergency department, workup revealed WBC 12.2 with CPK 5734. Serum creatinine was 2.53. Alcohol level was 170.  Although the pt had some withdrawal symptoms, he clinically improved after being placed on alcohol withdrawal protocol  Discharge Diagnoses:  Acute Pancreatitis -secondary to Etoh -presented with N/V, abd pain, lipase 156 -check lipids--LDL 78, Trig 129 -abd US--echogenic liver parenchyma, moderate renal atrophy, no cholelithiasis -advance to full liquids>>>soft diet which he tolerated  Acute kidney injury -Secondary to volume depletion due to GI losses -Continue IV fluids-->improving -Renal ultrasound--negative for hydronephrosis, mild right renal atrophy -discontinue losartan/HCTZ -serum creatinine 2.53 at  time of admission -serum creatinine 1.06 on day of d/c  Rhabdomyolysis -Alcohol-induced -Presenting CPK 5734>>> 4994>>2528>>>830 -Continue IV fluids -Check phosphorus--4.3 -TSH 2.528  Diarrhea -check C diff--negative -check stool pathogen panel--neg -improving  Hypomagnesemia/hypokalemia -Repleted -home with mag-ox 400 mg daily -recheck mag level in one week  Hyponatremia -Secondary to volume depletion and impaired renal rubs options secondary to AKI -Improving with IV fluids  Alcohol abuse and alcohol withdrawal -Cessation discussed -Alcohol withdrawal protocol  Thrombocytopenia -Secondary to chronic alcohol use -Check serum B12--494 -Check HIV--neg -no signs of bleeding -stable  Essential hypertension -Continue carvedilol--increase dose to 12.5 mg bid -d/c losartan/HCTZ  Hypocalcemia -replete--give additional calcium gluconate 7/22--2 grams -check 25-vitamin D--pending at time of discharge -start calcium supplement po bid  Transaminasemia -due to Etoh -Abd US--as above -improving  PTSD -continue the ativan that was prescribed by his PCP -f/u with his therapist outpt   Discharge Instructions  Discharge Instructions    Diet - low sodium heart healthy    Complete by:  As directed    Increase activity slowly    Complete by:  As directed      Allergies as of 03/22/2017      Reactions   Codeine    REACTION: Facial numbness      Medication List    STOP taking these medications   losartan-hydrochlorothiazide 100-25 MG tablet Commonly known as:  HYZAAR     TAKE these medications   calcium carbonate 1250 (500 Ca) MG tablet Commonly known as:  OS-CAL - dosed in mg of elemental calcium Take 1 tablet (500 mg of elemental calcium total) by mouth 2 (two) times daily with a meal.   carvedilol 12.5 MG tablet Commonly known as:  COREG Take 1 tablet (12.5  mg total) by mouth 2 (two) times daily with a meal. What changed:  medication  strength  how much to take  when to take this   LORazepam 1 MG tablet Commonly known as:  ATIVAN Take 1 tablet by mouth 3 (three) times daily.   magnesium oxide 400 (241.3 Mg) MG tablet Commonly known as:  MAG-OX Take 1 tablet (400 mg total) by mouth daily.       Allergies  Allergen Reactions  . Codeine     REACTION: Facial numbness    Consultations:  none   Procedures/Studies: Dg Chest 2 View  Result Date: 03/18/2017 CLINICAL DATA:  Patient states "some SOB since I don't know how long". Hx of HTN. Reason for exam stated as hyponatremia. EXAM: CHEST  2 VIEW COMPARISON:  None. FINDINGS: Cardiac silhouette is normal in size. No mediastinal or hilar masses. No evidence of adenopathy. Clear lungs.  No pleural effusion or pneumothorax. Skeletal structures are intact. IMPRESSION: No active cardiopulmonary disease. Electronically Signed   By: Lajean Manes M.D.   On: 03/18/2017 20:44   US Abdomen Complete  Result Date: 03/19/2017 CLINICAL DATA:  Elevated transaminases. EXAM: ABDOMEN ULTRASOUND COMPLETE COMPARISON:  No recent prior . FINDINGS: Gallbladder: No gallstones or wall thickening visualized. No sonographic Murphy sign noted by sonographer. Common bile duct: Diameter: 6.5 mm Liver: Increased hepatic echogenicity consistent fatty infiltration and/or hepatocellular disease. No focal hepatic abnormality identified. IVC: No abnormality visualized. Pancreas: Not visualized. Spleen: Size and appearance within normal limits. Right Kidney: Length: 9.9 cm. Echogenicity within normal limits. No mass or hydronephrosis visualized. Left Kidney: Length: 12.6 cm. Echogenicity within normal limits. No mass or hydronephrosis visualized. Abdominal aorta: No aneurysm visualized. Other findings: Limited exam due to patient's body habitus and overlying bowel gas. IMPRESSION: 1. Increased hepatic echogenicity consistent with fatty infiltration and/or hepatocellular disease. No focal hepatic abnormality  identified. No gallstones or biliary distention. 2. Mild right renal atrophy cannot be excluded. Exam limited due to patient's body habitus and overlying bowel gas. Electronically Signed   By: Marcello Moores  Register   On: 03/19/2017 10:00         Discharge Exam: Vitals:   03/21/17 2145 03/22/17 0550  BP: (!) 148/63 137/62  Pulse: 82 79  Resp: 16 18  Temp: 98.4 F (36.9 C) 98.1 F (36.7 C)   Vitals:   03/21/17 0736 03/21/17 1300 03/21/17 2145 03/22/17 0550  BP: (!) 143/77 137/72 (!) 148/63 137/62  Pulse: 83 72 82 79  Resp: 18 18 16 18   Temp: 98.5 F (36.9 C) 98.3 F (36.8 C) 98.4 F (36.9 C) 98.1 F (36.7 C)  TempSrc: Oral Oral Oral Oral  SpO2: 99% 97% 95% 98%  Weight:      Height:        General: Pt is alert, awake, not in acute distress Cardiovascular: RRR, S1/S2 +, no rubs, no gallops Respiratory: CTA bilaterally, no wheezing, no rhonchi Abdominal: Soft, NT, ND, bowel sounds + Extremities: no edema, no cyanosis   The results of significant diagnostics from this hospitalization (including imaging, microbiology, ancillary and laboratory) are listed below for reference.    Significant Diagnostic Studies: Dg Chest 2 View  Result Date: 03/18/2017 CLINICAL DATA:  Patient states "some SOB since I don't know how long". Hx of HTN. Reason for exam stated as hyponatremia. EXAM: CHEST  2 VIEW COMPARISON:  None. FINDINGS: Cardiac silhouette is normal in size. No mediastinal or hilar masses. No evidence of adenopathy. Clear lungs.  No pleural effusion  or pneumothorax. Skeletal structures are intact. IMPRESSION: No active cardiopulmonary disease. Electronically Signed   By: Lajean Manes M.D.   On: 03/18/2017 20:44   US Abdomen Complete  Result Date: 03/19/2017 CLINICAL DATA:  Elevated transaminases. EXAM: ABDOMEN ULTRASOUND COMPLETE COMPARISON:  No recent prior . FINDINGS: Gallbladder: No gallstones or wall thickening visualized. No sonographic Murphy sign noted by sonographer. Common  bile duct: Diameter: 6.5 mm Liver: Increased hepatic echogenicity consistent fatty infiltration and/or hepatocellular disease. No focal hepatic abnormality identified. IVC: No abnormality visualized. Pancreas: Not visualized. Spleen: Size and appearance within normal limits. Right Kidney: Length: 9.9 cm. Echogenicity within normal limits. No mass or hydronephrosis visualized. Left Kidney: Length: 12.6 cm. Echogenicity within normal limits. No mass or hydronephrosis visualized. Abdominal aorta: No aneurysm visualized. Other findings: Limited exam due to patient's body habitus and overlying bowel gas. IMPRESSION: 1. Increased hepatic echogenicity consistent with fatty infiltration and/or hepatocellular disease. No focal hepatic abnormality identified. No gallstones or biliary distention. 2. Mild right renal atrophy cannot be excluded. Exam limited due to patient's body habitus and overlying bowel gas. Electronically Signed   By: Marcello Moores  Register   On: 03/19/2017 10:00     Microbiology: Recent Results (from the past 240 hour(s))  C difficile quick scan w PCR reflex     Status: None   Collection Time: 03/20/17  1:21 PM  Result Value Ref Range Status   C Diff antigen NEGATIVE NEGATIVE Final   C Diff toxin NEGATIVE NEGATIVE Final   C Diff interpretation No C. difficile detected.  Final  Gastrointestinal Panel by PCR , Stool     Status: None   Collection Time: 03/20/17  4:25 PM  Result Value Ref Range Status   Campylobacter species NOT DETECTED NOT DETECTED Final   Plesimonas shigelloides NOT DETECTED NOT DETECTED Final   Salmonella species NOT DETECTED NOT DETECTED Final   Yersinia enterocolitica NOT DETECTED NOT DETECTED Final   Vibrio species NOT DETECTED NOT DETECTED Final   Vibrio cholerae NOT DETECTED NOT DETECTED Final   Enteroaggregative E coli (EAEC) NOT DETECTED NOT DETECTED Final   Enteropathogenic E coli (EPEC) NOT DETECTED NOT DETECTED Final   Enterotoxigenic E coli (ETEC) NOT DETECTED  NOT DETECTED Final   Shiga like toxin producing E coli (STEC) NOT DETECTED NOT DETECTED Final   Shigella/Enteroinvasive E coli (EIEC) NOT DETECTED NOT DETECTED Final   Cryptosporidium NOT DETECTED NOT DETECTED Final   Cyclospora cayetanensis NOT DETECTED NOT DETECTED Final   Entamoeba histolytica NOT DETECTED NOT DETECTED Final   Giardia lamblia NOT DETECTED NOT DETECTED Final   Adenovirus F40/41 NOT DETECTED NOT DETECTED Final   Astrovirus NOT DETECTED NOT DETECTED Final   Norovirus GI/GII NOT DETECTED NOT DETECTED Final   Rotavirus A NOT DETECTED NOT DETECTED Final   Sapovirus (I, II, IV, and V) NOT DETECTED NOT DETECTED Final     Labs: Basic Metabolic Panel:  Recent Labs Lab 03/18/17 1626 03/18/17 2112 03/19/17 0512 03/20/17 0705 03/21/17 0656 03/22/17 0720  NA 125*  --  130* 142 138 137  K 3.4*  --  3.5 3.7 3.9 4.3  CL 90*  --  98* 109 110 110  CO2 17*  --  18* 22 20* 19*  GLUCOSE 126*  --  134* 112* 105* 108*  BUN 25*  --  20 12 7  5*  CREATININE 2.53*  --  1.86* 1.53* 1.18 1.06  CALCIUM 7.3*  --  7.1* 7.1* 7.0* 7.0*  MG  --  0.6*  1.4*  --  0.7* 1.0*  PHOS  --  4.3  --   --   --   --    Liver Function Tests:  Recent Labs Lab 03/18/17 1626 03/19/17 0512 03/20/17 0705 03/21/17 0656 03/22/17 0720  AST 170* 156* 122* 107* 79*  ALT 62 54 51 52 47  ALKPHOS 77 66 63 59 57  BILITOT 1.0 1.4* 0.9 0.7 0.7  PROT 7.8 6.7 6.4* 6.5 6.5  ALBUMIN 4.1 3.4* 3.2* 3.2* 3.2*    Recent Labs Lab 03/19/17 0512  LIPASE 163*   No results for input(s): AMMONIA in the last 168 hours. CBC:  Recent Labs Lab 03/18/17 1626 03/19/17 0512 03/20/17 0705 03/21/17 0656  WBC 12.2* 7.7 5.0 5.2  NEUTROABS 9.5*  --   --   --   HGB 13.3 11.9* 10.9* 10.8*  HCT 36.2* 33.0* 30.7* 31.0*  MCV 96.8 98.5 101.7* 101.6*  PLT 124* 89* 82* 100*   Cardiac Enzymes:  Recent Labs Lab 03/18/17 1626 03/19/17 0512 03/20/17 0705 03/21/17 0656 03/22/17 0720  CKTOTAL 5,734* 4,994* 2,856*  1,620* 830*   BNP: Invalid input(s): POCBNP CBG: No results for input(s): GLUCAP in the last 168 hours.  Time coordinating discharge:  Greater than 30 minutes  Signed:  Leina Babe, DO [facility] Pager: [phone] 03/22/2017, 11:53 AM

## 2017-03-26 LAB — VITAMIN D 25 HYDROXY (VIT D DEFICIENCY, FRACTURES): Vit D, 25-Hydroxy: 13.7 ng/mL — ABNORMAL LOW (ref 30.0–100.0)

## 2017-05-12 ENCOUNTER — Encounter (HOSPITAL_COMMUNITY): Payer: Self-pay | Admitting: Emergency Medicine

## 2017-05-12 ENCOUNTER — Emergency Department (HOSPITAL_COMMUNITY)
Admission: EM | Admit: 2017-05-12 | Discharge: 2017-05-12 | Disposition: A | Discharge status: Home / Self Care | Payer: BLUE CROSS/BLUE SHIELD | Attending: Emergency Medicine | Admitting: Emergency Medicine

## 2017-05-12 DIAGNOSIS — I10 Essential (primary) hypertension: Secondary | ICD-10-CM | POA: Insufficient documentation

## 2017-05-12 DIAGNOSIS — E875 Hyperkalemia: Secondary | ICD-10-CM

## 2017-05-12 DIAGNOSIS — L03113 Cellulitis of right upper limb: Secondary | ICD-10-CM

## 2017-05-12 LAB — CBC WITH DIFFERENTIAL/PLATELET
Basophils Absolute: 0 10*3/uL (ref 0.0–0.1)
Basophils Relative: 0 %
Eosinophils Absolute: 0.1 10*3/uL (ref 0.0–0.7)
Eosinophils Relative: 1 %
HCT: 42.5 % (ref 39.0–52.0)
Hemoglobin: 14.2 g/dL (ref 13.0–17.0)
Lymphocytes Relative: 10 %
Lymphs Abs: 1.5 10*3/uL (ref 0.7–4.0)
MCH: 35.1 pg — ABNORMAL HIGH (ref 26.0–34.0)
MCHC: 33.4 g/dL (ref 30.0–36.0)
MCV: 105.2 fL — ABNORMAL HIGH (ref 78.0–100.0)
Monocytes Absolute: 0.8 10*3/uL (ref 0.1–1.0)
Monocytes Relative: 5 %
Neutro Abs: 13.1 10*3/uL — ABNORMAL HIGH (ref 1.7–7.7)
Neutrophils Relative %: 84 %
Platelets: 205 10*3/uL (ref 150–400)
RBC: 4.04 MIL/uL — ABNORMAL LOW (ref 4.22–5.81)
RDW: 14.1 % (ref 11.5–15.5)
WBC: 15.5 10*3/uL — ABNORMAL HIGH (ref 4.0–10.5)

## 2017-05-12 LAB — BASIC METABOLIC PANEL
Anion gap: 9 (ref 5–15)
BUN: 23 mg/dL — ABNORMAL HIGH (ref 6–20)
CO2: 24 mmol/L (ref 22–32)
Calcium: 9.5 mg/dL (ref 8.9–10.3)
Chloride: 103 mmol/L (ref 101–111)
Creatinine, Ser: 1.13 mg/dL (ref 0.61–1.24)
GFR calc Af Amer: >60 mL/min (ref >60)
GFR calc non Af Amer: >60 mL/min (ref >60)
Glucose, Bld: 126 mg/dL — ABNORMAL HIGH (ref 65–99)
Potassium: 5.5 mmol/L — ABNORMAL HIGH (ref 3.5–5.1)
Sodium: 136 mmol/L (ref 135–145)

## 2017-05-12 LAB — URINALYSIS, ROUTINE W REFLEX MICROSCOPIC
Bilirubin Urine: NEGATIVE
Glucose, UA: NEGATIVE mg/dL
Hgb urine dipstick: NEGATIVE
Ketones, ur: NEGATIVE mg/dL
Leukocytes, UA: NEGATIVE
Nitrite: NEGATIVE
Protein, ur: NEGATIVE mg/dL
Specific Gravity, Urine: 1.017 (ref 1.005–1.030)
pH: 6 (ref 5.0–8.0)

## 2017-05-12 NOTE — ED Provider Notes (Addendum)
[facility] DEPT Provider Note   CSN: [id number] Arrival date & time: 05/12/17  1541     History   Chief Complaint Chief Complaint  Patient presents with  . Abnormal Lab    HPI Chris Flynn is a 61 y.o. male.  Patient contacted primary care doctor for new Keflex. Patient is being treated for right arm cellulitis. They decided to continue the Keflex for another 5 days. Patient starting at 6 days of treatment. Patient has a history of alcohol dependence. But is currently sober. Patient was then informed of lab work that was done a week ago showed abnormal kidney function and a need to be seen in the emergency department. Patient completely asymptomatic.      Past Medical History:  Diagnosis Date  . Alcohol abuse   . Hypertension   . PTSD (post-traumatic stress disorder)     Patient Active Problem List   Diagnosis Date Noted  . Alcoholic pancreatitis 67/61/9509  . Hypomagnesemia 03/19/2017  . Thrombocytopenia ([place]) 03/19/2017  . Hyponatremia   . Transaminasemia   . Rhabdomyolysis 03/18/2017  . AKI (acute kidney injury) ([place]) 03/18/2017  . Alcohol dependence with uncomplicated withdrawal ([place]) 03/18/2017  . Hypocalcemia 03/18/2017  . HYPERLIPIDEMIA 03/23/2007  . HYPERTENSION 03/23/2007  . ALLERGIC RHINITIS 03/23/2007    Past Surgical History:  Procedure Laterality Date  . NECK SURGERY         Home Medications    Prior to Admission medications   Medication Sig Start Date End Date Taking? Authorizing Provider  calcium carbonate (OS-CAL - DOSED IN MG OF ELEMENTAL CALCIUM) 1250 (500 Ca) MG tablet Take 1 tablet (500 mg of elemental calcium total) by mouth 2 (two) times daily with a meal. 03/22/17   Tat, Shanon Brow, MD  carvedilol (COREG) 12.5 MG tablet Take 1 tablet (12.5 mg total) by mouth 2 (two) times daily with a meal. 03/22/17   Tat, Shanon Brow, MD  LORazepam (ATIVAN) 1 MG tablet Take 1 tablet by mouth 3 (three) times daily. 03/13/17   [provider]    magnesium oxide (MAG-OX) 400 (241.3 Mg) MG tablet Take 1 tablet (400 mg total) by mouth daily. 03/22/17   Orson Eva, MD    Family History History reviewed. No pertinent family history.  Social History Social History  Substance Use Topics  . Smoking status: Never Smoker  . Smokeless tobacco: Never Used  . Alcohol use No     Allergies   Codeine   Review of Systems Review of Systems  Constitutional: Negative for fever.  HENT: Negative for congestion.   Eyes: Negative for visual disturbance.  Respiratory: Negative for shortness of breath.   Cardiovascular: Negative for chest pain.  Gastrointestinal: Negative for abdominal pain.  Genitourinary: Negative for dysuria.  Musculoskeletal: Negative for back pain.  Skin: Positive for rash.  Neurological: Negative for headaches.  Hematological: Does not bruise/bleed easily.  Psychiatric/Behavioral: Negative for confusion.     Physical Exam Updated Vital Signs BP 133/88 (BP Location: Right Arm)   Pulse 69   Temp 98.6 F (37 C) (Oral)   Resp 18   Ht 1.727 m (5\' 8" )   Wt 90.7 kg (200 lb)   SpO2 97%   BMI 30.41 kg/m   Physical Exam  Constitutional: He is oriented to person, place, and time. He appears well-developed and well-nourished. No distress.  HENT:  Head: Normocephalic and atraumatic.  Mouth/Throat: Oropharynx is clear and moist.  Eyes: Pupils are equal, round, and reactive to light. Conjunctivae and EOM  are normal.  Neck: Normal range of motion. Neck supple.  Cardiovascular: Normal rate, regular rhythm and normal heart sounds.   Pulmonary/Chest: Effort normal and breath sounds normal. No respiratory distress.  Abdominal: Soft. Bowel sounds are normal.  Musculoskeletal: Normal range of motion. He exhibits edema.  Normal except for right forearm and elbow elbow with some erythema and some swelling forearm with some scattered erythema. Overall patient states that this is improved.  Neurological: He is alert and  oriented to person, place, and time. He displays normal reflexes. No cranial nerve deficit or sensory deficit. He exhibits normal muscle tone. Coordination normal.  Skin: Skin is warm. There is erythema.  Nursing note and vitals reviewed.    ED Treatments / Results  Labs (all labs ordered are listed, but only abnormal results are displayed) Labs Reviewed  CBC WITH DIFFERENTIAL/PLATELET - Abnormal; Notable for the following:       Result Value   WBC 15.5 (*)    RBC 4.04 (*)    MCV 105.2 (*)    MCH 35.1 (*)    Neutro Abs 13.1 (*)    All other components within normal limits  BASIC METABOLIC PANEL - Abnormal; Notable for the following:    Potassium 5.5 (*)    Glucose, Bld 126 (*)    BUN 23 (*)    All other components within normal limits  URINALYSIS, ROUTINE W REFLEX MICROSCOPIC    EKG  EKG Interpretation None       Radiology No results found.  Procedures Procedures (including critical care time)  Medications Ordered in ED Medications - No data to display   Initial Impression / Assessment and Plan / ED Course  I have reviewed the triage vital signs and the nursing notes.  Pertinent labs & imaging results that were available during my care of the patient were reviewed by me and considered in my medical decision making (see chart for details).    Patient with elevated potassium at 5.5. However renal function is completely normal. Patient's had low platelets in the past those are normal. Patient with past history of alcohol abuse but has been sober. Patient was sent in by primary care provider for of abnormal renal labs that were done a week ago. Patient is taking a multivitamin that has a fair amount of potassium in it. Requesting that maybe he go to a lower dose of potassium in his multivitamin and check with the pharmacist. Needs follow-up next week potassium recheck. Patient does not require admission at this point in time. Patient is asymptomatic other than the  cellulitis to his right forearm which has been improving. And he is taking Keflex for that. Although it's not completely resolved.  I suspect that elevated white blood cell count is secondary to the cellulitis.  Although potassium is elevated. Is less than 6. Cardiac monitoring without evidence of hyperkalemic changes.  Close follow-up with primary care doctor will be important.     Final Clinical Impressions(s) / ED Diagnoses   Final diagnoses:  Hyperkalemia  Cellulitis of right upper extremity    New Prescriptions Discharge Medication List as of 05/12/2017  7:48 PM       Fredia Sorrow, MD 05/12/17 1949    Fredia Sorrow, MD 05/12/17 2024

## 2017-05-12 NOTE — Discharge Instructions (Signed)
Recommend for now changing multivitamin but does not have large amounts of potassium in it. Recommend having potassium rechecked in the next several days. Okay to continue the Keflex. Return for any worsening of the cellulitis. Make an appointment to follow-up with your doctor.

## 2017-05-12 NOTE — ED Triage Notes (Signed)
Pt reports was told to come here for rehydration. Pt reports pcp reported called and stated "get to ER you are on the verge of kidney failure." pt reports continued elbow pain and reports has been treated with Keflex for cellulitis for last 12 days. Pt denies pain but reports urinary frequency.

## 2017-05-19 ENCOUNTER — Observation Stay (HOSPITAL_COMMUNITY)
Admission: EM | Admit: 2017-05-19 | Discharge: 2017-05-21 | Disposition: A | Discharge status: Home / Self Care | Payer: Self-pay | Attending: Orthopedic Surgery | Admitting: Orthopedic Surgery

## 2017-05-19 ENCOUNTER — Observation Stay (HOSPITAL_COMMUNITY): Payer: Self-pay

## 2017-05-19 ENCOUNTER — Emergency Department (HOSPITAL_COMMUNITY): Payer: Self-pay

## 2017-05-19 ENCOUNTER — Encounter (HOSPITAL_COMMUNITY): Payer: Self-pay

## 2017-05-19 DIAGNOSIS — L0291 Cutaneous abscess, unspecified: Secondary | ICD-10-CM | POA: Diagnosis present

## 2017-05-19 DIAGNOSIS — Z79899 Other long term (current) drug therapy: Secondary | ICD-10-CM | POA: Insufficient documentation

## 2017-05-19 DIAGNOSIS — Z01818 Encounter for other preprocedural examination: Secondary | ICD-10-CM

## 2017-05-19 DIAGNOSIS — L0390 Cellulitis, unspecified: Secondary | ICD-10-CM

## 2017-05-19 DIAGNOSIS — L03113 Cellulitis of right upper limb: Secondary | ICD-10-CM | POA: Diagnosis present

## 2017-05-19 DIAGNOSIS — K860 Alcohol-induced chronic pancreatitis: Secondary | ICD-10-CM | POA: Insufficient documentation

## 2017-05-19 DIAGNOSIS — N179 Acute kidney failure, unspecified: Secondary | ICD-10-CM | POA: Insufficient documentation

## 2017-05-19 DIAGNOSIS — I10 Essential (primary) hypertension: Secondary | ICD-10-CM | POA: Insufficient documentation

## 2017-05-19 DIAGNOSIS — M71021 Abscess of bursa, right elbow: Principal | ICD-10-CM | POA: Insufficient documentation

## 2017-05-19 DIAGNOSIS — D696 Thrombocytopenia, unspecified: Secondary | ICD-10-CM | POA: Insufficient documentation

## 2017-05-19 DIAGNOSIS — E785 Hyperlipidemia, unspecified: Secondary | ICD-10-CM | POA: Insufficient documentation

## 2017-05-19 DIAGNOSIS — F4310 Post-traumatic stress disorder, unspecified: Secondary | ICD-10-CM | POA: Insufficient documentation

## 2017-05-19 DIAGNOSIS — J309 Allergic rhinitis, unspecified: Secondary | ICD-10-CM | POA: Insufficient documentation

## 2017-05-19 DIAGNOSIS — E871 Hypo-osmolality and hyponatremia: Secondary | ICD-10-CM | POA: Insufficient documentation

## 2017-05-19 DIAGNOSIS — R52 Pain, unspecified: Secondary | ICD-10-CM

## 2017-05-19 DIAGNOSIS — M6282 Rhabdomyolysis: Secondary | ICD-10-CM | POA: Insufficient documentation

## 2017-05-19 DIAGNOSIS — Z23 Encounter for immunization: Secondary | ICD-10-CM | POA: Insufficient documentation

## 2017-05-19 LAB — COMPREHENSIVE METABOLIC PANEL
ALT: 48 U/L (ref 17–63)
AST: 44 U/L — ABNORMAL HIGH (ref 15–41)
Albumin: 3 g/dL — ABNORMAL LOW (ref 3.5–5.0)
Alkaline Phosphatase: 80 U/L (ref 38–126)
Anion gap: 11 (ref 5–15)
BUN: 11 mg/dL (ref 6–20)
CO2: 22 mmol/L (ref 22–32)
Calcium: 9 mg/dL (ref 8.9–10.3)
Chloride: 99 mmol/L — ABNORMAL LOW (ref 101–111)
Creatinine, Ser: 1.14 mg/dL (ref 0.61–1.24)
GFR calc Af Amer: >60 mL/min (ref >60)
GFR calc non Af Amer: >60 mL/min (ref >60)
Glucose, Bld: 103 mg/dL — ABNORMAL HIGH (ref 65–99)
Potassium: 4.4 mmol/L (ref 3.5–5.1)
Sodium: 132 mmol/L — ABNORMAL LOW (ref 135–145)
Total Bilirubin: 0.8 mg/dL (ref 0.3–1.2)
Total Protein: 7.4 g/dL (ref 6.5–8.1)

## 2017-05-19 LAB — CBC WITH DIFFERENTIAL/PLATELET
Basophils Absolute: 0.1 10*3/uL (ref 0.0–0.1)
Basophils Relative: 1 %
Eosinophils Absolute: 0.1 10*3/uL (ref 0.0–0.7)
Eosinophils Relative: 1 %
HCT: 35.1 % — ABNORMAL LOW (ref 39.0–52.0)
Hemoglobin: 11.8 g/dL — ABNORMAL LOW (ref 13.0–17.0)
Lymphocytes Relative: 15 %
Lymphs Abs: 1.5 10*3/uL (ref 0.7–4.0)
MCH: 34.6 pg — ABNORMAL HIGH (ref 26.0–34.0)
MCHC: 33.6 g/dL (ref 30.0–36.0)
MCV: 102.9 fL — ABNORMAL HIGH (ref 78.0–100.0)
Monocytes Absolute: 1.1 10*3/uL — ABNORMAL HIGH (ref 0.1–1.0)
Monocytes Relative: 11 %
Neutro Abs: 7.1 10*3/uL (ref 1.7–7.7)
Neutrophils Relative %: 72 %
Platelets: 254 10*3/uL (ref 150–400)
RBC: 3.41 MIL/uL — ABNORMAL LOW (ref 4.22–5.81)
RDW: 13.3 % (ref 11.5–15.5)
WBC: 9.9 10*3/uL (ref 4.0–10.5)

## 2017-05-19 LAB — LACTIC ACID, PLASMA: Lactic Acid, Venous: 0.9 mmol/L (ref 0.5–1.9)

## 2017-05-19 MED ORDER — IOPAMIDOL (ISOVUE-300) INJECTION 61%
100.0000 mL | Freq: Once | INTRAVENOUS | Status: AC | PRN
  Administered 2017-05-19: 100 mL via INTRAVENOUS

## 2017-05-19 MED ORDER — TRAMADOL HCL 50 MG PO TABS: 50.0000 mg | ORAL_TABLET | Freq: Four times a day (QID) | ORAL | Status: DC | PRN

## 2017-05-19 MED ORDER — ONDANSETRON HCL 4 MG PO TABS: 4.0000 mg | ORAL_TABLET | Freq: Three times a day (TID) | ORAL | Status: DC | PRN

## 2017-05-19 MED ORDER — ONDANSETRON HCL 4 MG/2ML IJ SOLN: 4.0000 mg | Freq: Three times a day (TID) | INTRAMUSCULAR | Status: DC | PRN

## 2017-05-19 MED ORDER — ACETAMINOPHEN 650 MG RE SUPP: 650.0000 mg | Freq: Four times a day (QID) | RECTAL | Status: DC | PRN

## 2017-05-19 MED ORDER — CARVEDILOL 3.125 MG PO TABS
6.2500 mg | ORAL_TABLET | Freq: Two times a day (BID) | ORAL | Status: DC
  Administered 2017-05-20 – 2017-05-21 (×2): 6.25 mg via ORAL
  Filled 2017-05-19 (×2): qty 2
  Filled 2017-05-19: qty 1

## 2017-05-19 MED ORDER — ADULT MULTIVITAMIN W/MINERALS CH
1.0000 | ORAL_TABLET | Freq: Every day | ORAL | Status: DC
  Administered 2017-05-19 – 2017-05-20 (×2): 1 via ORAL
  Filled 2017-05-19 (×2): qty 1

## 2017-05-19 MED ORDER — VITAMIN D 1000 UNITS PO TABS
1000.0000 [IU] | ORAL_TABLET | Freq: Every day | ORAL | Status: DC
  Administered 2017-05-20: 1000 [IU] via ORAL
  Filled 2017-05-19 (×3): qty 1

## 2017-05-19 MED ORDER — CHLORHEXIDINE GLUCONATE 4 % EX LIQD
60.0000 mL | Freq: Once | CUTANEOUS | Status: AC
  Administered 2017-05-20: 4 via TOPICAL
  Filled 2017-05-19: qty 60

## 2017-05-19 MED ORDER — ENSURE ENLIVE PO LIQD: 237.0000 mL | Freq: Two times a day (BID) | ORAL | Status: DC

## 2017-05-19 MED ORDER — SULFAMETHOXAZOLE-TRIMETHOPRIM 800-160 MG PO TABS
1.0000 | ORAL_TABLET | Freq: Two times a day (BID) | ORAL | Status: DC
  Administered 2017-05-19 – 2017-05-21 (×4): 1 via ORAL
  Filled 2017-05-19 (×4): qty 1

## 2017-05-19 MED ORDER — VANCOMYCIN HCL IN DEXTROSE 1-5 GM/200ML-% IV SOLN
1000.0000 mg | Freq: Once | INTRAVENOUS | Status: AC
  Administered 2017-05-19: 1000 mg via INTRAVENOUS
  Filled 2017-05-19: qty 200

## 2017-05-19 MED ORDER — HEPARIN SODIUM (PORCINE) 5000 UNIT/ML IJ SOLN
5000.0000 [IU] | Freq: Three times a day (TID) | INTRAMUSCULAR | Status: DC
  Administered 2017-05-19 – 2017-05-21 (×4): 5000 [IU] via SUBCUTANEOUS
  Filled 2017-05-19 (×5): qty 1

## 2017-05-19 MED ORDER — ACETAMINOPHEN 325 MG PO TABS
650.0000 mg | ORAL_TABLET | Freq: Four times a day (QID) | ORAL | Status: DC | PRN
  Administered 2017-05-19: 650 mg via ORAL
  Filled 2017-05-19: qty 2

## 2017-05-19 MED ORDER — INFLUENZA VAC SPLIT QUAD 0.5 ML IM SUSY
0.5000 mL | PREFILLED_SYRINGE | INTRAMUSCULAR | Status: AC
  Administered 2017-05-21: 0.5 mL via INTRAMUSCULAR
  Filled 2017-05-19: qty 0.5

## 2017-05-19 MED ORDER — CALCIUM CARBONATE 1250 (500 CA) MG PO TABS
1.0000 | ORAL_TABLET | Freq: Every day | ORAL | Status: DC
  Administered 2017-05-20 – 2017-05-21 (×2): 500 mg via ORAL
  Filled 2017-05-19 (×4): qty 1

## 2017-05-19 MED ORDER — HYDROMORPHONE HCL 1 MG/ML IJ SOLN
0.5000 mg | INTRAMUSCULAR | Status: DC | PRN
  Administered 2017-05-20 (×2): 0.5 mg via INTRAVENOUS
  Filled 2017-05-19 (×2): qty 1

## 2017-05-19 MED ORDER — VANCOMYCIN HCL IN DEXTROSE 1-5 GM/200ML-% IV SOLN
1000.0000 mg | Freq: Two times a day (BID) | INTRAVENOUS | Status: DC
  Administered 2017-05-20 – 2017-05-21 (×2): 1000 mg via INTRAVENOUS
  Filled 2017-05-19 (×3): qty 200

## 2017-05-19 MED ORDER — MAGNESIUM OXIDE 400 (241.3 MG) MG PO TABS
400.0000 mg | ORAL_TABLET | Freq: Every day | ORAL | Status: DC
  Administered 2017-05-20: 400 mg via ORAL
  Filled 2017-05-19 (×3): qty 1

## 2017-05-19 NOTE — ED Triage Notes (Signed)
Pt sent over from Dr Nevada Crane due to cellulitis in right elbow. Pt with cellulitis for 2 weeks. Pt is on his second round of antibiotics. Dr hall concerned with septic arthritis

## 2017-05-19 NOTE — ED Provider Notes (Signed)
Emergency Department Provider Note   I have reviewed the triage vital signs and the nursing notes.   HISTORY  Chief Complaint Cellulitis   HPI Chris Flynn is a 61 y.o. male 61 year old male patient hypertension, alcoholism status post detoxification a few weeks ago the presents to the emergency department today with persistent right arm cellulitis has not improved. Patient initially started on Keflex a few weeks ago when he had right forearm and elbow cellulitis. He was on this for a couple weeks without any improvementof the pain and swelling on his elbow however did have improvement in his forearm cellulitis. Started having diffuse skin peeling during that time as well over his whole body. His right thumbnail also became deformed during that time. He was seen here last week and then follow up with his primary doctor and started Bactrim a few days ago.he's had persistent right elbow erythema tenderness warmth and swelling since that time but has not ever got better and has progressively worsened. His PCP saw him today and thought there was concern for possible septic arthritis and discussed it with Dr. Aline Brochure with orthopedic surgery who recommended coming here to be evaluated further.Patient has had a decreased appetite and some weight loss with nausea. No vomiting, diarrhea, chest pain, back pain abdominal pain, syncope or other associated symptoms. No other modifying symptoms. He states that it does hurt worse to the lightest touch. With range of motion he states that the skin hurts but the joint itself does not seem to bother him much.   Past Medical History:  Diagnosis Date  . Alcohol abuse   . Hypertension   . PTSD (post-traumatic stress disorder)     Patient Active Problem List   Diagnosis Date Noted  . Alcoholic pancreatitis 29/56/2130  . Hypomagnesemia 03/19/2017  . Thrombocytopenia ([place]) 03/19/2017  . Hyponatremia   . Transaminasemia   . Rhabdomyolysis 03/18/2017    . AKI (acute kidney injury) ([place]) 03/18/2017  . Alcohol dependence with uncomplicated withdrawal ([place]) 03/18/2017  . Hypocalcemia 03/18/2017  . HYPERLIPIDEMIA 03/23/2007  . HYPERTENSION 03/23/2007  . ALLERGIC RHINITIS 03/23/2007    Past Surgical History:  Procedure Laterality Date  . NECK SURGERY      Current Outpatient Rx  . Order #: [id number] Class: Normal  . Order #: [id number] Class: Normal  . Order #: [id number] Class: Historical Med  . Order #: [id number] Class: Normal    Allergies Codeine  No family history on file.  Social History Social History  Substance Use Topics  . Smoking status: Never Smoker  . Smokeless tobacco: Never Used  . Alcohol use No    Review of Systems  All other systems negative except as documented in the HPI. All pertinent positives and negatives as reviewed in the HPI. ____________________________________________   PHYSICAL EXAM:  VITAL SIGNS: ED Triage Vitals  Enc Vitals Group     BP 05/19/17 1543 124/77     Pulse Rate 05/19/17 1543 75     Resp 05/19/17 1543 18     Temp 05/19/17 1543 98.9 F (37.2 C)     Temp Source 05/19/17 1543 Oral     SpO2 05/19/17 1543 99 %     Weight 05/19/17 1543 200 lb (90.7 kg)     Height --      Head Circumference --      [facility] Flow --      Pain Score 05/19/17 1542 10     Pain Loc --      Pain Edu? --  Excl. in [place]? --     Constitutional: Alert and oriented. Well appearing and in no acute distress. Eyes: Conjunctivae are normal. PERRL. EOMI. Head: Atraumatic. Nose: No congestion/rhinnorhea. Mouth/Throat: Mucous membranes are moist.  Oropharynx non-erythematous. Neck: No stridor.  No meningeal signs.   Cardiovascular: Normal rate, regular rhythm. Good peripheral circulation. Grossly normal heart sounds.   Respiratory: Normal respiratory effort.  No retractions. Lungs CTAB. Gastrointestinal: Soft and nontender. No distention.  Musculoskeletal: No lower extremity tenderness nor edema. No gross  deformities of extremities. Neurologic:  Normal speech and language. No gross focal neurologic deficits are appreciated.  Skin:  Skin is warm, dry and intact.large area around right elbow of warmth, edema, erythema, tenderness and induration without a fluctuant area. Range of motion of right elbow appears did not elicit much pain unless it is at the extremes. Has a healed area of cellulitis on his forearm that is purplish in color. Has diffuse desquamation of a couple layers of his epidermis on multiple extremities and torso seems to be improving. No mucosal involvement.   ____________________________________________   LABS (all labs ordered are listed, but only abnormal results are displayed)  Labs Reviewed - No data to display ____________________________________________  RADIOLOGY  No results found.  ____________________________________________   PROCEDURES  Procedure(s) performed:   Procedures   ____________________________________________   INITIAL IMPRESSION / ASSESSMENT AND PLAN / ED COURSE  Pertinent labs & imaging results that were available during my care of the patient were reviewed by me and considered in my medical decision making (see chart for details).  Patient was adamant about his self-pay status and that he wanted to keep the workup as cost effective as possible will still be in safe. I discussed the radiologist the best way to evaluate for evidence of abscess and less likely septic arthritis with imaging. MRI is the best however with the patient's self-pay status and financial concerns we will proceed with CT scan with contrast. We'll start vancomycin after blood cultures were drawn. Suspect patient likely be admitted for cellulitis that is refractory to outpatient treatment.  CT scan of multiple abscesses one that is surrounding them triceps tendon insertion. Discussed this with with [facility], Dr. Aline Brochure, who will admit. We'll continue vancomycin.  Holding orders placed. ____________________________________________  FINAL CLINICAL IMPRESSION(S) / ED DIAGNOSES  Final diagnoses:  Abscess  Cellulitis, unspecified cellulitis site  Pre-op evaluation     MEDICATIONS GIVEN DURING THIS VISIT:  Medications - No data to display   NEW OUTPATIENT MEDICATIONS STARTED DURING THIS VISIT:  New Prescriptions   No medications on file    Note:  This document was prepared using Dragon voice recognition software and may include unintentional dictation errors.   Merrily Pew, MD 05/20/17 [phone]

## 2017-05-19 NOTE — ED Notes (Signed)
Patient transported to CT 

## 2017-05-19 NOTE — Progress Notes (Signed)
Pharmacy Antibiotic Note  Chris Flynn is a 61 y.o. male admitted on 05/19/2017 with cellulitis.  Pharmacy has been consulted for Vancomycin dosing.  Plan: Vancomycin 1gm IV every 12 hours.  Goal trough 10-15 mcg/mL.  Monitor labs, micro and vitals.   Weight: 200 lb (90.7 kg)  Temp (24hrs), Avg:98.9 F (37.2 C), Min:98.9 F (37.2 C), Max:98.9 F (37.2 C)   Recent Labs Lab 05/19/17 1640 05/19/17 1654  WBC 9.9  --   CREATININE 1.14  --   LATICACIDVEN  --  0.9    Estimated Creatinine Clearance: 75.3 mL/min (by C-G formula based on SCr of 1.14 mg/dL).    Allergies  Allergen Reactions  . Codeine     REACTION: Facial numbness    Antimicrobials this admission: Vanc 9/19 >>   Dose adjustments this admission: n/a   Microbiology results: 9/19 BCx: pending  UCx:    Sputum:    MRSA PCR:   Thank you for allowing pharmacy to be a part of this patient's care.  Pricilla Larsson 05/19/2017 8:29 PM

## 2017-05-19 NOTE — Progress Notes (Signed)
Patient ID: Chris Flynn, male   DOB: Nov 28, 1955, 61 y.o.   MRN: [id number]  Chief Complaint  Patient presents with  . Cellulitis  right elbow / arm   61 yo 2 week history of right arm pain swelling erythema . hes been to the ER and his primary care doctor for oral antibioics. The cellulitis improved but now he can not move his arm and his CT with iv contrast shows 2 3 cm abscesses  Admitted for further IV therapy and possible surgical drainage   Note he initially started on Keflex and took that for about 2 weeks. He says he was getting better except for the elbow which is where the fluid collection settled after the forearm cellulitis improved. He says that once he started taking Keflex he had  skin peeling all over his body. His primary care doctor switched him over to Bactrim a day and a half ago.  He can move his elbow but it's painful.    Review of Systems  Constitutional: Negative for chills, fever and malaise/fatigue.  HENT: Negative.   Eyes: Negative.   Respiratory: Negative.   Cardiovascular: Negative.   Gastrointestinal: Negative.   Genitourinary: Negative.   Musculoskeletal: Positive for joint pain.  Skin: Positive for rash.  Neurological: Negative.   Endo/Heme/Allergies: Negative.   Psychiatric/Behavioral: Negative.   All other systems reviewed and are negative.  Past Medical History:  Diagnosis Date  . Alcohol abuse   . Hypertension   . PTSD (post-traumatic stress disorder)    Past Surgical History:  Procedure Laterality Date  . NECK SURGERY     Social History  Substance Use Topics  . Smoking status: Never Smoker  . Smokeless tobacco: Never Used  . Alcohol use No   No family history on file.  Current Facility-Administered Medications:  .  HYDROmorphone (DILAUDID) injection 0.5 mg, 0.5 mg, Intravenous, Q3H PRN, Mesner, Corene Cornea, MD .  ondansetron (ZOFRAN) injection 4 mg, 4 mg, Intravenous, Q8H PRN, Mesner, Corene Cornea, MD  Current Outpatient Prescriptions:   .  calcium carbonate (OS-CAL - DOSED IN MG OF ELEMENTAL CALCIUM) 1250 (500 Ca) MG tablet, Take 1 tablet (500 mg of elemental calcium total) by mouth 2 (two) times daily with a meal. (Patient taking differently: Take 1 tablet by mouth daily with breakfast. ), Disp: 30 tablet, Rfl: 0 .  carvedilol (COREG) 12.5 MG tablet, Take 1 tablet (12.5 mg total) by mouth 2 (two) times daily with a meal. (Patient taking differently: Take 6.25 mg by mouth 2 (two) times daily with a meal. ), Disp: 60 tablet, Rfl: 0 .  cholecalciferol (VITAMIN D) 1000 units tablet, Take 1,000 Units by mouth daily., Disp: , Rfl:  .  Cyanocobalamin (B-12 PO), Take 1 tablet by mouth daily., Disp: , Rfl:  .  magnesium oxide (MAG-OX) 400 (241.3 Mg) MG tablet, Take 1 tablet (400 mg total) by mouth daily., Disp: 30 tablet, Rfl: 0 .  Multiple Vitamin (MULTIVITAMIN WITH MINERALS) TABS tablet, Take 1 tablet by mouth daily., Disp: , Rfl:  .  naproxen sodium (ALEVE) 220 MG tablet, Take 220-440 mg by mouth daily as needed., Disp: , Rfl:  .  ondansetron (ZOFRAN) 4 MG tablet, Take 4 mg by mouth every 8 (eight) hours as needed for nausea or vomiting. , Disp: , Rfl: 0 .  sulfamethoxazole-trimethoprim (BACTRIM DS,SEPTRA DS) 800-160 MG tablet, Take 1 tablet by mouth 2 (two) times daily., Disp: , Rfl: 0 .  cephALEXin (KEFLEX) 500 MG capsule, Take 500 mg by mouth  3 (three) times daily. 7 day course, Disp: , Rfl: 0   CBC Latest Ref Rng & Units 05/19/2017 05/12/2017 03/21/2017  WBC 4.0 - 10.5 K/uL 9.9 15.5(H) 5.2  Hemoglobin 13.0 - 17.0 g/dL 11.8(L) 14.2 10.8(L)  Hematocrit 39.0 - 52.0 % 35.1(L) 42.5 31.0(L)  Platelets 150 - 400 K/uL 254 205 100(L)   BMP Latest Ref Rng & Units 05/19/2017 05/12/2017 03/22/2017  Glucose 65 - 99 mg/dL 103(H) 126(H) 108(H)  BUN 6 - 20 mg/dL 11 23(H) 5(L)  Creatinine 0.61 - 1.24 mg/dL 1.14 1.13 1.06  Sodium 135 - 145 mmol/L 132(L) 136 137  Potassium 3.5 - 5.1 mmol/L 4.4 5.5(H) 4.3  Chloride 101 - 111 mmol/L 99(L) 103 110   CO2 22 - 32 mmol/L 22 24 19(L)  Calcium 8.9 - 10.3 mg/dL 9.0 9.5 7.0(L)    BP 124/73   Pulse 77   Temp 98.9 F (37.2 C) (Oral)   Resp 18   Wt 200 lb (90.7 kg)   SpO2 100%   BMI 30.41 kg/m   General body habitus is normal He is oriented 3 His mood is pleasant his affect is normal We did not observe his gait as it is noncontributory but he says he walks normally  Physical Exam  Constitutional: He is oriented to person, place, and time.  HENT:  Head: Normocephalic and atraumatic.  Right Ear: External ear normal.  Left Ear: External ear normal.  Mouth/Throat: No oropharyngeal exudate.  Eyes: Pupils are equal, round, and reactive to light. Conjunctivae and EOM are normal. Right eye exhibits no discharge. Left eye exhibits no discharge. No scleral icterus.  Neck: Normal range of motion. Neck supple. No JVD present. No tracheal deviation present. No thyromegaly present.  Cardiovascular: Normal rate and intact distal pulses.   Pulmonary/Chest: Effort normal and breath sounds normal. No stridor. He exhibits no tenderness.  Abdominal: Soft. He exhibits no distension and no mass. There is no guarding.  Musculoskeletal:  Lower extremities normal range of motion stability strength alignment without tenderness skin rash throughout his extremities  Left upper extremity normal range of motion stability strength alignment without tenderness but he does have a skin rash as well  Right upper extremity various patches of erythema and skin rash. Normal but painful range of motion right elbow wrist and shoulder. Alignment normal. 2 fluid collections near the elbow. Elbow is stable. Strength normal flexion-extension elbow.  He has no lymphadenopathy in the epitrochlear region or axilla of the right or left arm  Lymphadenopathy:    He has no cervical adenopathy.  Neurological: He is alert and oriented to person, place, and time. He displays normal reflexes. No cranial nerve deficit. He exhibits  normal muscle tone. Coordination normal.  Skin: Rash noted. There is erythema.  Psychiatric: Mood, memory, affect and judgment normal.    I interpret the CT scan as: CT scan with contrast shows to 3 cm abscesses.  CT report  lIMPRESSION: 1. Two separate complex appearing fluid collections within the soft tissues posterior to the right elbow, both presumed abscesses. 2. Most prominent collection is seen posterior to the olecranon, measuring 3 x 1.1 x 1.9 cm, with surrounding soft tissue thickening, and with an additional thin contiguous component extending to the right of midline. The deeper component of this presumed abscess collection abuts the underlying olecranon, however, there is no CT evidence of osteomyelitis seen at this time. Image numbers provided above. 3. The additional less well-defined collection is seen within the deeper soft tissues  posterior to the distal humerus, just above the trochlear notch, measuring 3.5 x 1.1 x 1.3 cm, also with surrounding soft tissue thickening, not abutting the underlying humerus. This collection appears to involve the distal portion of the triceps muscle insertion. Image numbers provided above. 4. Neither collection appears to be contiguous with the underlying joint space. No joint effusion seen. 5. As above, no evidence of osteomyelitis. 6. Large amount of associated fluid/edema within the surrounding subcutaneous soft tissues.  Chest x-ray report IMPRESSION: Slight scarring left base. No edema or consolidation. Stable cardiac silhouette.   Electronically Signed   By: Lowella Grip III M.D.   On: 05/19/2017 21:19   Diagnosis cellulitis right arm and abscess right elbow Skin rash cause unknown  Plan admission IV vancomycin  Surgical drainage right elbow

## 2017-05-20 ENCOUNTER — Encounter (HOSPITAL_COMMUNITY)
Admission: EM | Disposition: A | Discharge status: Home / Self Care | Payer: Self-pay | Source: Home / Self Care | Attending: Emergency Medicine

## 2017-05-20 ENCOUNTER — Encounter (HOSPITAL_COMMUNITY): Payer: Self-pay | Admitting: *Deleted

## 2017-05-20 ENCOUNTER — Observation Stay (HOSPITAL_COMMUNITY): Payer: Self-pay

## 2017-05-20 ENCOUNTER — Observation Stay (HOSPITAL_COMMUNITY): Payer: Self-pay | Admitting: Anesthesiology

## 2017-05-20 HISTORY — PX: INCISION AND DRAINAGE ABSCESS: SHX5864

## 2017-05-20 LAB — SURGICAL PCR SCREEN
MRSA, PCR: NEGATIVE
Staphylococcus aureus: NEGATIVE

## 2017-05-20 SURGERY — INCISION AND DRAINAGE, ABSCESS
Anesthesia: General | Site: Elbow | Laterality: Right

## 2017-05-20 MED ORDER — ONDANSETRON 4 MG PO TBDP
ORAL_TABLET | ORAL | Status: AC
  Filled 2017-05-20: qty 1

## 2017-05-20 MED ORDER — OXYCODONE HCL 5 MG PO TABS
5.0000 mg | ORAL_TABLET | Freq: Once | ORAL | Status: AC | PRN
  Administered 2017-05-20: 5 mg via ORAL
  Filled 2017-05-20: qty 1

## 2017-05-20 MED ORDER — HYDROCODONE-ACETAMINOPHEN 7.5-325 MG PO TABS
1.0000 | ORAL_TABLET | ORAL | Status: DC | PRN
  Administered 2017-05-21: 1 via ORAL
  Filled 2017-05-20: qty 1

## 2017-05-20 MED ORDER — FENTANYL CITRATE (PF) 100 MCG/2ML IJ SOLN
50.0000 ug | Freq: Once | INTRAMUSCULAR | Status: AC
  Administered 2017-05-20: 50 ug via INTRAVENOUS

## 2017-05-20 MED ORDER — HYDROMORPHONE HCL 1 MG/ML IJ SOLN: 0.2500 mg | INTRAMUSCULAR | Status: DC | PRN

## 2017-05-20 MED ORDER — POVIDONE-IODINE 10 % EX SWAB: 2.0000 "application " | Freq: Once | CUTANEOUS | Status: DC

## 2017-05-20 MED ORDER — ONDANSETRON 4 MG PO TBDP
4.0000 mg | ORAL_TABLET | Freq: Once | ORAL | Status: AC
  Administered 2017-05-20: 4 mg via ORAL

## 2017-05-20 MED ORDER — LACTATED RINGERS IV SOLN
INTRAVENOUS | Status: DC
  Administered 2017-05-20: 1000 mL via INTRAVENOUS

## 2017-05-20 MED ORDER — LIDOCAINE HCL (CARDIAC) 10 MG/ML IV SOLN
INTRAVENOUS | Status: DC | PRN
  Administered 2017-05-20: 40 mg via INTRAVENOUS

## 2017-05-20 MED ORDER — SODIUM CHLORIDE 0.9 % IR SOLN
Status: DC | PRN
Start: 2017-05-20 — End: 2017-05-20
  Administered 2017-05-20: 1000 mL

## 2017-05-20 MED ORDER — FENTANYL CITRATE (PF) 100 MCG/2ML IJ SOLN
INTRAMUSCULAR | Status: DC | PRN
Start: 2017-05-20 — End: 2017-05-20
  Administered 2017-05-20 (×2): 50 ug via INTRAVENOUS
  Administered 2017-05-20 (×3): 25 ug via INTRAVENOUS
  Administered 2017-05-20: 50 ug via INTRAVENOUS
  Administered 2017-05-20: 25 ug via INTRAVENOUS

## 2017-05-20 MED ORDER — ACETAMINOPHEN 500 MG PO TABS
500.0000 mg | ORAL_TABLET | Freq: Four times a day (QID) | ORAL | Status: DC | PRN
  Administered 2017-05-20: 500 mg via ORAL
  Filled 2017-05-20: qty 1

## 2017-05-20 MED ORDER — FENTANYL CITRATE (PF) 100 MCG/2ML IJ SOLN
INTRAMUSCULAR | Status: AC
  Filled 2017-05-20: qty 2

## 2017-05-20 MED ORDER — OXYCODONE HCL 5 MG/5ML PO SOLN: 5.0000 mg | Freq: Once | ORAL | Status: DC | PRN

## 2017-05-20 MED ORDER — PROPOFOL 10 MG/ML IV BOLUS
INTRAVENOUS | Status: DC | PRN
  Administered 2017-05-20: 170 mg via INTRAVENOUS

## 2017-05-20 MED ORDER — BUPIVACAINE-EPINEPHRINE (PF) 0.5% -1:200000 IJ SOLN
INTRAMUSCULAR | Status: AC
  Filled 2017-05-20: qty 30

## 2017-05-20 MED ORDER — MIDAZOLAM HCL 2 MG/2ML IJ SOLN
1.0000 mg | Freq: Once | INTRAMUSCULAR | Status: AC | PRN
  Administered 2017-05-20: 2 mg via INTRAVENOUS
  Filled 2017-05-20: qty 2

## 2017-05-20 SURGICAL SUPPLY — 34 items
BAG HAMPER (MISCELLANEOUS) ×2 IMPLANT
BANDAGE COBAN STERILE 2 (GAUZE/BANDAGES/DRESSINGS) IMPLANT
BANDAGE ELASTIC 4 LF NS (GAUZE/BANDAGES/DRESSINGS) ×2 IMPLANT
BANDAGE ESMARK 4X12 BL STRL LF (DISPOSABLE) ×1 IMPLANT
BNDG CONFORM 2 STRL LF (GAUZE/BANDAGES/DRESSINGS) ×2 IMPLANT
BNDG ESMARK 4X12 BLUE STRL LF (DISPOSABLE) ×2
BNDG GAUZE ELAST 4 BULKY (GAUZE/BANDAGES/DRESSINGS) ×2 IMPLANT
CLOTH BEACON ORANGE TIMEOUT ST (SAFETY) ×2 IMPLANT
COVER LIGHT HANDLE STERIS (MISCELLANEOUS) ×4 IMPLANT
DRAPE ORTHO 2.5IN SPLIT 77X108 (DRAPES) IMPLANT
DRAPE ORTHO SPLIT 77X108 STRL (DRAPES)
ELECT REM PT RETURN 9FT ADLT (ELECTROSURGICAL) ×2
ELECTRODE REM PT RTRN 9FT ADLT (ELECTROSURGICAL) ×1 IMPLANT
GAUZE SPONGE 4X4 12PLY STRL (GAUZE/BANDAGES/DRESSINGS) IMPLANT
GAUZE XEROFORM 5X9 LF (GAUZE/BANDAGES/DRESSINGS) ×2 IMPLANT
GLOVE BIOGEL PI IND STRL 7.0 (GLOVE) ×1 IMPLANT
GLOVE BIOGEL PI INDICATOR 7.0 (GLOVE) ×1
GLOVE OPTIFIT SS 8.0 STRL (GLOVE) ×2 IMPLANT
GLOVE SKINSENSE NS SZ8.0 LF (GLOVE) ×1
GLOVE SKINSENSE STRL SZ8.0 LF (GLOVE) ×1 IMPLANT
GOWN STRL REUS W/TWL LRG LVL3 (GOWN DISPOSABLE) ×2 IMPLANT
GOWN STRL REUS W/TWL XL LVL3 (GOWN DISPOSABLE) ×2 IMPLANT
INST SET MINOR BONE (KITS) ×2 IMPLANT
KIT ROOM TURNOVER APOR (KITS) ×2 IMPLANT
MANIFOLD NEPTUNE II (INSTRUMENTS) ×2 IMPLANT
MARKER SKIN DUAL TIP RULER LAB (MISCELLANEOUS) ×2 IMPLANT
NS IRRIG 1000ML POUR BTL (IV SOLUTION) ×2 IMPLANT
PACK BASIC LIMB (CUSTOM PROCEDURE TRAY) IMPLANT
PACK MINOR (CUSTOM PROCEDURE TRAY) IMPLANT
PAD ABD 5X9 TENDERSORB (GAUZE/BANDAGES/DRESSINGS) ×2 IMPLANT
PAD ARMBOARD 7.5X6 YLW CONV (MISCELLANEOUS) ×2 IMPLANT
SET BASIN LINEN APH (SET/KITS/TRAYS/PACK) ×2 IMPLANT
STAPLER VISISTAT 35W (STAPLE) ×2 IMPLANT
SYR BULB IRRIGATION 50ML (SYRINGE) ×2 IMPLANT

## 2017-05-20 NOTE — Op Note (Signed)
05/20/2017  3:33 PM  PATIENT:  Chris Flynn  61 y.o. male  PRE-OPERATIVE DIAGNOSIS:  abscess right elbow  POST-OPERATIVE DIAGNOSIS:  abscess right elbow  PROCEDURE:  Procedure(s): INCISION AND DRAINAGE ABSCESS right elbow (Right)  Operative findings phlegmon no obvious abscess, probable infected olecranon bursa  Details of procedure  I saw the patient preop area and marked his elbow as a surgical site. I reviewed his chart. I confirmed the site of surgery with him.  We took him to surgery he had general anesthesia. He was in supine position. Sterile prep and drape with ChloraPrep  No tourniquet was used  Timeout was completed  Midline incision was made subcutaneous tissue was explored with finger dissection. No abscess was found. Based on the CT scan showing abscess under the triceps tendon 9 size the triceps tendon longitudinally and then bluntly dissected through that area and found no evidence of infection  I closed the triceps with #1 Vicryl interrupted sutures closed the skin with 0 Monocryl and staples except for the superior portion which I packed with a wet saline gauze  Sterile dressing applied over the top and Ace wrap.  The patient was extubated taken to recovery room in stable condition  The postoperative plan is to change the dressing in the morning and then continue oral antibiotics at home with Bactrim as that seemed to be working nicely.   SURGEON:  Surgeon(s) and Role:    * Carole Civil, MD - Primary  PHYSICIAN ASSISTANT:   ASSISTANTS: betty ashley    ANESTHESIA:   general  EBL:  Total I/O In: -  Out: 30 [Blood:30]  BLOOD ADMINISTERED:none  DRAINS: none   LOCAL MEDICATIONS USED:  none    SPECIMEN:  No Specimen  DISPOSITION OF SPECIMEN:  microbiology  COUNTS:  YES  TOURNIQUET:  * No tourniquets in log *  DICTATION: .Dragon Dictation  PLAN OF CARE: Admit for overnight observation  PATIENT DISPOSITION:  PACU - hemodynamically  stable.   Delay start of Pharmacological VTE agent (>24hrs) due to surgical blood loss or risk of bleeding: no

## 2017-05-20 NOTE — Plan of Care (Signed)
Problem: Pain Managment: Goal: General experience of comfort will improve Outcome: Progressing Pt c/o minimal pain in R elbow d/t cellulitis. Pt educated on pain mgmt as well as medications available. Will continue to monitor pt

## 2017-05-20 NOTE — Interval H&P Note (Signed)
History and Physical Interval Note:  05/20/2017 2:36 PM  Chris Flynn  has presented today for surgery, with the diagnosis of abscess right elbow  The various methods of treatment have been discussed with the patient and family. After consideration of risks, benefits and other options for treatment, the patient has consented to  Procedure(s): INCISION AND DRAINAGE ABSCESS right elbow (Right) as a surgical intervention .  The patient's history has been reviewed, patient examined, no change in status, stable for surgery.  I have reviewed the patient's chart and labs.  Questions were answered to the patient's satisfaction.     Arther Abbott

## 2017-05-20 NOTE — Anesthesia Postprocedure Evaluation (Signed)
Anesthesia Post Note  Patient: Chris Flynn  Procedure(s) Performed: Procedure(s) (LRB): INCISION AND DRAINAGE ABSCESS right elbow (Right)  Patient location during evaluation: PACU Anesthesia Type: General Level of consciousness: awake and alert and oriented Pain management: pain level controlled Vital Signs Assessment: post-procedure vital signs reviewed and stable Respiratory status: spontaneous breathing Cardiovascular status: blood pressure returned to baseline Postop Assessment: no apparent nausea or vomiting Anesthetic complications: no     Last Vitals:  Vitals:   05/20/17 1542 05/20/17 1545  BP: (!) 146/94 (!) 152/89  Pulse: 87 84  Resp: (!) 21 (!) 22  Temp: 37.1 C   SpO2: 93% 96%    Last Pain:  Vitals:   05/20/17 1545  TempSrc:   PainSc: 7                  Airyana Sprunger

## 2017-05-20 NOTE — Progress Notes (Signed)
Initial Nutrition Assessment  DOCUMENTATION CODES:  Not applicable  INTERVENTION:  Ensure Enlive po BID, each supplement provides 350 kcal and 20 grams of protein  NUTRITION DIAGNOSIS:  Inadequate oral intake related to nausea, poor appetite, acute illness (Cellutlitis) as evidenced by apparent loss of >10% bw in 2 months  GOAL:  Patient will meet greater than or equal to 90% of their needs  MONITOR:  PO intake, Supplement acceptance, Diet advancement, Labs, Weight trends  REASON FOR ASSESSMENT:  Malnutrition Screening Tool    ASSESSMENT:  61 y/o male w/ PMHx HTN, PTSD, ETOH abuse (few weeks sober). Presented from PCP appt due to concern for septic arthritis. Has had cellulitis on elbow for a few weeks that has been refractory to oral abx. Condones decreased appetite, nausea, weight loss. Admitted for IV abx and I/D.   RD stopped by room 2x and both times out of room for procedure. Unable to obtain  Any information from patient at this time.   No documented meal intake since arrival has been NPO.   Per review of chart, patient has lost 25 lbs in 2 months. This is a clinically significant wt loss of >10% bw.   Will order supplements due to perceived poor intake and monitor meals. Will attempt to see tomorrow if time allows.   Physical Exam: Unable to assess  Labs: Albumin: 3.0, Na: 132, H/H:11.8/35.1,  Meds: Calc Carb, Vit D, MVI w/ min, Mag ox, Ensure Enlive, IV abx.   Recent Labs Lab 05/19/17 1640  NA 132*  K 4.4  CL 99*  CO2 22  BUN 11  CREATININE 1.14  CALCIUM 9.0  GLUCOSE 103*   Diet Order:  Diet NPO time specified Except for: Sips with Meds  Skin: Cellulitis and abscess on R forearm  Last BM:  9/19  Height:  Ht Readings from Last 1 Encounters:  05/19/17 5\' 8"  (1.727 m)   Weight:  Wt Readings from Last 1 Encounters:  05/19/17 188 lb 9.6 oz (85.5 kg)   Wt Readings from Last 10 Encounters:  05/19/17 188 lb 9.6 oz (85.5 kg)  05/12/17 200 lb (90.7 kg)   03/18/17 213 lb 3 oz (96.7 kg)   Ideal Body Weight:  70 kg  BMI:  Body mass index is 28.68 kg/m.  Estimated Nutritional Needs:  Kcal:  1900-2150 (22-25 kcal/kg bw) Protein:  77-91g Pro (1.1-1.3 g/kg bw) Fluid:  1.9-2.1 L fluid (1 ml/kcal bw)  EDUCATION NEEDS:  No education needs identified at this time  Burtis Junes RD, LDN, [facility] Pager: [phone] 05/20/2017 3:44 PM

## 2017-05-20 NOTE — Anesthesia Procedure Notes (Signed)
Procedure Name: LMA Insertion Date/Time: 05/20/2017 2:50 PM Performed by: Tressie Stalker E Pre-anesthesia Checklist: Patient identified, Patient being monitored, Emergency Drugs available, Timeout performed and Suction available Patient Re-evaluated:Patient Re-evaluated prior to induction Oxygen Delivery Method: Circle System Utilized Preoxygenation: Pre-oxygenation with 100% oxygen Induction Type: IV induction Ventilation: Mask ventilation without difficulty LMA: LMA inserted LMA Size: 4.0 Number of attempts: 1 Placement Confirmation: positive ETCO2 and breath sounds checked- equal and bilateral

## 2017-05-20 NOTE — Transfer of Care (Signed)
Immediate Anesthesia Transfer of Care Note  Patient: Chris Flynn  Procedure(s) Performed: Procedure(s): INCISION AND DRAINAGE ABSCESS right elbow (Right)  Patient Location: PACU  Anesthesia Type:General  Level of Consciousness: awake, alert  and oriented  Airway & Oxygen Therapy: Patient Spontanous Breathing  Post-op Assessment: Post -op Vital signs reviewed and stable  Post vital signs: Reviewed and stable  Last Vitals:  Vitals:   05/20/17 1333 05/20/17 1542  BP: 133/75 (!) 146/94  Pulse:  87  Resp: 18 (!) 21  Temp: 37.3 C 37.1 C  SpO2: 95% 93%    Last Pain:  Vitals:   05/20/17 1333  TempSrc: Oral  PainSc: 7       Patients Stated Pain Goal: 7 (96/29/52 8413)  Complications: No apparent anesthesia complications

## 2017-05-20 NOTE — Anesthesia Preprocedure Evaluation (Signed)
Anesthesia Evaluation  Patient identified by MRN, date of birth, ID band Patient awake    Reviewed: Allergy & Precautions, NPO status , Patient's Chart, lab work & pertinent test results, reviewed documented beta blocker date and time   Airway Mallampati: I       Dental no notable dental hx. (+) Teeth Intact   Pulmonary neg pulmonary ROS,    Pulmonary exam normal        Cardiovascular Exercise Tolerance: Good hypertension, Pt. on medications  Rhythm:Regular Rate:Normal     Neuro/Psych negative psych ROS   GI/Hepatic negative GI ROS, Neg liver ROS,   Endo/Other    Renal/GU Results for DAWID, DUPRIEST (MRN [id number]) as of 05/20/2017 13:28  05/19/2017 16:40 BUN: 11 Creatinine: 1.14  Results for DAYMEN, HASSEBROCK (MRN [id number]) as of 05/20/2017 13:28  05/12/2017 16:27 Sodium: 136 Potassium: 5.5 (H)  05/19/2017 16:40 Sodium: 132 (L) Potassium: 4.4      Musculoskeletal   Abdominal Normal abdominal exam  (+)   Peds  Hematology   Anesthesia Other Findings   Reproductive/Obstetrics                             Anesthesia Physical Anesthesia Plan  ASA: II  Anesthesia Plan: General   Post-op Pain Management:    Induction: Intravenous  PONV Risk Score and Plan:   Airway Management Planned: LMA  Additional Equipment:   Intra-op Plan:   Post-operative Plan: Extubation in OR  Informed Consent: I have reviewed the patients History and Physical, chart, labs and discussed the procedure including the risks, benefits and alternatives for the proposed anesthesia with the patient or authorized representative who has indicated his/her understanding and acceptance.   Dental advisory given  Plan Discussed with: CRNA  Anesthesia Plan Comments:         Anesthesia Quick Evaluation

## 2017-05-20 NOTE — H&P (View-Only) (Signed)
Patient ID: Chris Flynn, male   DOB: Jan 27, 1956, 61 y.o.   MRN: [id number]  Chief Complaint  Patient presents with  . Cellulitis  right elbow / arm   61 yo 2 week history of right arm pain swelling erythema . hes been to the ER and his primary care doctor for oral antibioics. The cellulitis improved but now he can not move his arm and his CT with iv contrast shows 2 3 cm abscesses  Admitted for further IV therapy and possible surgical drainage   Note he initially started on Keflex and took that for about 2 weeks. He says he was getting better except for the elbow which is where the fluid collection settled after the forearm cellulitis improved. He says that once he started taking Keflex he had  skin peeling all over his body. His primary care doctor switched him over to Bactrim a day and a half ago.  He can move his elbow but it's painful.    Review of Systems  Constitutional: Negative for chills, fever and malaise/fatigue.  HENT: Negative.   Eyes: Negative.   Respiratory: Negative.   Cardiovascular: Negative.   Gastrointestinal: Negative.   Genitourinary: Negative.   Musculoskeletal: Positive for joint pain.  Skin: Positive for rash.  Neurological: Negative.   Endo/Heme/Allergies: Negative.   Psychiatric/Behavioral: Negative.   All other systems reviewed and are negative.  Past Medical History:  Diagnosis Date  . Alcohol abuse   . Hypertension   . PTSD (post-traumatic stress disorder)    Past Surgical History:  Procedure Laterality Date  . NECK SURGERY     Social History  Substance Use Topics  . Smoking status: Never Smoker  . Smokeless tobacco: Never Used  . Alcohol use No   No family history on file.  Current Facility-Administered Medications:  .  HYDROmorphone (DILAUDID) injection 0.5 mg, 0.5 mg, Intravenous, Q3H PRN, Mesner, Corene Cornea, MD .  ondansetron (ZOFRAN) injection 4 mg, 4 mg, Intravenous, Q8H PRN, Mesner, Corene Cornea, MD  Current Outpatient Prescriptions:   .  calcium carbonate (OS-CAL - DOSED IN MG OF ELEMENTAL CALCIUM) 1250 (500 Ca) MG tablet, Take 1 tablet (500 mg of elemental calcium total) by mouth 2 (two) times daily with a meal. (Patient taking differently: Take 1 tablet by mouth daily with breakfast. ), Disp: 30 tablet, Rfl: 0 .  carvedilol (COREG) 12.5 MG tablet, Take 1 tablet (12.5 mg total) by mouth 2 (two) times daily with a meal. (Patient taking differently: Take 6.25 mg by mouth 2 (two) times daily with a meal. ), Disp: 60 tablet, Rfl: 0 .  cholecalciferol (VITAMIN D) 1000 units tablet, Take 1,000 Units by mouth daily., Disp: , Rfl:  .  Cyanocobalamin (B-12 PO), Take 1 tablet by mouth daily., Disp: , Rfl:  .  magnesium oxide (MAG-OX) 400 (241.3 Mg) MG tablet, Take 1 tablet (400 mg total) by mouth daily., Disp: 30 tablet, Rfl: 0 .  Multiple Vitamin (MULTIVITAMIN WITH MINERALS) TABS tablet, Take 1 tablet by mouth daily., Disp: , Rfl:  .  naproxen sodium (ALEVE) 220 MG tablet, Take 220-440 mg by mouth daily as needed., Disp: , Rfl:  .  ondansetron (ZOFRAN) 4 MG tablet, Take 4 mg by mouth every 8 (eight) hours as needed for nausea or vomiting. , Disp: , Rfl: 0 .  sulfamethoxazole-trimethoprim (BACTRIM DS,SEPTRA DS) 800-160 MG tablet, Take 1 tablet by mouth 2 (two) times daily., Disp: , Rfl: 0 .  cephALEXin (KEFLEX) 500 MG capsule, Take 500 mg by mouth  3 (three) times daily. 7 day course, Disp: , Rfl: 0   CBC Latest Ref Rng & Units 05/19/2017 05/12/2017 03/21/2017  WBC 4.0 - 10.5 K/uL 9.9 15.5(H) 5.2  Hemoglobin 13.0 - 17.0 g/dL 11.8(L) 14.2 10.8(L)  Hematocrit 39.0 - 52.0 % 35.1(L) 42.5 31.0(L)  Platelets 150 - 400 K/uL 254 205 100(L)   BMP Latest Ref Rng & Units 05/19/2017 05/12/2017 03/22/2017  Glucose 65 - 99 mg/dL 103(H) 126(H) 108(H)  BUN 6 - 20 mg/dL 11 23(H) 5(L)  Creatinine 0.61 - 1.24 mg/dL 1.14 1.13 1.06  Sodium 135 - 145 mmol/L 132(L) 136 137  Potassium 3.5 - 5.1 mmol/L 4.4 5.5(H) 4.3  Chloride 101 - 111 mmol/L 99(L) 103 110   CO2 22 - 32 mmol/L 22 24 19(L)  Calcium 8.9 - 10.3 mg/dL 9.0 9.5 7.0(L)    BP 124/73   Pulse 77   Temp 98.9 F (37.2 C) (Oral)   Resp 18   Wt 200 lb (90.7 kg)   SpO2 100%   BMI 30.41 kg/m   General body habitus is normal He is oriented 3 His mood is pleasant his affect is normal We did not observe his gait as it is noncontributory but he says he walks normally  Physical Exam  Constitutional: He is oriented to person, place, and time.  HENT:  Head: Normocephalic and atraumatic.  Right Ear: External ear normal.  Left Ear: External ear normal.  Mouth/Throat: No oropharyngeal exudate.  Eyes: Pupils are equal, round, and reactive to light. Conjunctivae and EOM are normal. Right eye exhibits no discharge. Left eye exhibits no discharge. No scleral icterus.  Neck: Normal range of motion. Neck supple. No JVD present. No tracheal deviation present. No thyromegaly present.  Cardiovascular: Normal rate and intact distal pulses.   Pulmonary/Chest: Effort normal and breath sounds normal. No stridor. He exhibits no tenderness.  Abdominal: Soft. He exhibits no distension and no mass. There is no guarding.  Musculoskeletal:  Lower extremities normal range of motion stability strength alignment without tenderness skin rash throughout his extremities  Left upper extremity normal range of motion stability strength alignment without tenderness but he does have a skin rash as well  Right upper extremity various patches of erythema and skin rash. Normal but painful range of motion right elbow wrist and shoulder. Alignment normal. 2 fluid collections near the elbow. Elbow is stable. Strength normal flexion-extension elbow.  He has no lymphadenopathy in the epitrochlear region or axilla of the right or left arm  Lymphadenopathy:    He has no cervical adenopathy.  Neurological: He is alert and oriented to person, place, and time. He displays normal reflexes. No cranial nerve deficit. He exhibits  normal muscle tone. Coordination normal.  Skin: Rash noted. There is erythema.  Psychiatric: Mood, memory, affect and judgment normal.    I interpret the CT scan as: CT scan with contrast shows to 3 cm abscesses.  CT report  lIMPRESSION: 1. Two separate complex appearing fluid collections within the soft tissues posterior to the right elbow, both presumed abscesses. 2. Most prominent collection is seen posterior to the olecranon, measuring 3 x 1.1 x 1.9 cm, with surrounding soft tissue thickening, and with an additional thin contiguous component extending to the right of midline. The deeper component of this presumed abscess collection abuts the underlying olecranon, however, there is no CT evidence of osteomyelitis seen at this time. Image numbers provided above. 3. The additional less well-defined collection is seen within the deeper soft tissues  posterior to the distal humerus, just above the trochlear notch, measuring 3.5 x 1.1 x 1.3 cm, also with surrounding soft tissue thickening, not abutting the underlying humerus. This collection appears to involve the distal portion of the triceps muscle insertion. Image numbers provided above. 4. Neither collection appears to be contiguous with the underlying joint space. No joint effusion seen. 5. As above, no evidence of osteomyelitis. 6. Large amount of associated fluid/edema within the surrounding subcutaneous soft tissues.  Chest x-ray report IMPRESSION: Slight scarring left base. No edema or consolidation. Stable cardiac silhouette.   Electronically Signed   By: Lowella Grip III M.D.   On: 05/19/2017 21:19   Diagnosis cellulitis right arm and abscess right elbow Skin rash cause unknown  Plan admission IV vancomycin  Surgical drainage right elbow

## 2017-05-20 NOTE — Brief Op Note (Signed)
05/19/2017 - 05/20/2017  3:33 PM  PATIENT:  Chris Flynn  61 y.o. male  PRE-OPERATIVE DIAGNOSIS:  abscess right elbow  POST-OPERATIVE DIAGNOSIS:  abscess right elbow  PROCEDURE:  Procedure(s): INCISION AND DRAINAGE ABSCESS right elbow (Right)  Operative findings phlegmon no obvious abscess, probable infected olecranon bursa  Details of procedure  I saw the patient preop area and marked his elbow as a surgical site. I reviewed his chart. I confirmed the site of surgery with him.  We took him to surgery he had general anesthesia. He was in supine position. Sterile prep and drape with ChloraPrep  No tourniquet was used  Timeout was completed  Midline incision was made subcutaneous tissue was explored with finger dissection. No abscess was found. Based on the CT scan showing abscess under the triceps tendon 9 size the triceps tendon longitudinally and then bluntly dissected through that area and found no evidence of infection  I closed the triceps with #1 Vicryl interrupted sutures closed the skin with 0 Monocryl and staples except for the superior portion which I packed with a wet saline gauze  Sterile dressing applied over the top and Ace wrap.  The patient was extubated taken to recovery room in stable condition  The postoperative plan is to change the dressing in the morning and then continue oral antibiotics at home with Bactrim as that seemed to be working nicely.   SURGEON:  Surgeon(s) and Role:    * Carole Civil, MD - Primary  PHYSICIAN ASSISTANT:   ASSISTANTS: betty ashley    ANESTHESIA:   general  EBL:  Total I/O In: -  Out: 30 [Blood:30]  BLOOD ADMINISTERED:none  DRAINS: none   LOCAL MEDICATIONS USED:  none    SPECIMEN:  No Specimen  DISPOSITION OF SPECIMEN:  microbiology  COUNTS:  YES  TOURNIQUET:  * No tourniquets in log *  DICTATION: .Dragon Dictation  PLAN OF CARE: Admit for overnight observation  PATIENT DISPOSITION:  PACU -  hemodynamically stable.   Delay start of Pharmacological VTE agent (>24hrs) due to surgical blood loss or risk of bleeding: no

## 2017-05-20 NOTE — Progress Notes (Signed)
Dr. Aline Brochure paged and made aware of pts temperature of 103.0. Rn rechecked temp with MD on phone. Also adv MD tylenol was discontinued as well as having no po pain medications ordered. MD stated he would put orders in.  Pt asymptomatic, no sweating, pt states he feels fine. Will continue to monitor pt

## 2017-05-21 ENCOUNTER — Encounter: Payer: Self-pay | Admitting: Orthopedic Surgery

## 2017-05-21 ENCOUNTER — Encounter (HOSPITAL_COMMUNITY): Payer: Self-pay | Admitting: Orthopedic Surgery

## 2017-05-21 MED ORDER — HYDROCODONE-ACETAMINOPHEN 7.5-325 MG PO TABS: 1.0000 | ORAL_TABLET | ORAL | 0 refills | Status: DC | PRN

## 2017-05-21 MED ORDER — SULFAMETHOXAZOLE-TRIMETHOPRIM 800-160 MG PO TABS: 1.0000 | ORAL_TABLET | Freq: Two times a day (BID) | ORAL | 0 refills | Status: DC

## 2017-05-21 NOTE — Plan of Care (Signed)
Problem: Pain Managment: Goal: General experience of comfort will improve Outcome: Adequate for Discharge Pt c/o minimal pain this shift. No pain medications have been given so far. Dr. Aline Brochure explained to given po pain medications if possible, pt made aware. Pt educated on pain medications as well as medications available. Will continue to monitor pt

## 2017-05-21 NOTE — Care Management Note (Signed)
Case Management Note  Patient Details  Name: BURDETT PINZON MRN: [id number] Date of Birth: 12/26/1955  Subjective/Objective:                  Admitted with abscess. Chart reviewed for CM needs. Pt lives with S/O, ind with ALD's. Has PCP, transportation and f/u with surgeon. He has no insurance. His only prescription at DC is for narcotic which [employer] voucher will not cover.   Action/Plan: DC home today with self care. No CM needs noted at DC.  Expected Discharge Date:  05/21/17               Expected Discharge Plan:  Home/Self Care  In-House Referral:  NA  Discharge planning Services  CM Consult  Post Acute Care Choice:  NA Choice offered to:  NA  Status of Service:  Completed, signed off   Sherald Barge, RN 05/21/2017, 8:41 AM

## 2017-05-21 NOTE — Discharge Summary (Signed)
Physician Discharge Summary  Patient ID: Chris Flynn MRN: [id number] DOB/AGE: 1956/06/11 61 y.o.  Admit date: 05/19/2017 Discharge date: 05/21/2017  Admission Diagnoses: Cellulitis right forearm  Discharge Diagnoses: Cellulitis right forearm infected olecranon bursa Active Problems:   Abscess   Cellulitis of right arm   Discharged Condition: good  Hospital Course: The patient was admitted on September 19 for IV antibiotics and evaluation for possible abscess right arm. The patient had a CT scan with IV contrast (refused MRI based on cost). The CT scan suggested abscess right forearm. Patient was started on vancomycin  On September 20 he underwent incision and drainage of right elbow. Operative findings at the time included phlegmon no frank abscess. The wound was packed with normal saline partially closed. The patient did have a 103 fever the night of the 20th. This defervesced with Tylenol without recurrence.  On September 21 she was afebrile he felt fine. His dressing was clean dry and intact. The nurse change the dressing prior to discharge  Significant Diagnostic Studies: CBC Latest Ref Rng & Units 05/19/2017 05/12/2017 03/21/2017  WBC 4.0 - 10.5 K/uL 9.9 15.5(H) 5.2  Hemoglobin 13.0 - 17.0 g/dL 11.8(L) 14.2 10.8(L)  Hematocrit 39.0 - 52.0 % 35.1(L) 42.5 31.0(L)  Platelets 150 - 400 K/uL 254 205 100(L)   IMPRESSION: CT scan with contrast 1. Two separate complex appearing fluid collections within the soft tissues posterior to the right elbow, both presumed abscesses. 2. Most prominent collection is seen posterior to the olecranon, measuring 3 x 1.1 x 1.9 cm, with surrounding soft tissue thickening, and with an additional thin contiguous component extending to the right of midline. The deeper component of this presumed abscess collection abuts the underlying olecranon, however, there is no CT evidence of osteomyelitis seen at this time. Image numbers provided above. 3. The  additional less well-defined collection is seen within the deeper soft tissues posterior to the distal humerus, just above the trochlear notch, measuring 3.5 x 1.1 x 1.3 cm, also with surrounding soft tissue thickening, not abutting the underlying humerus. This collection appears to involve the distal portion of the triceps muscle insertion. Image numbers provided above. 4. Neither collection appears to be contiguous with the underlying joint space. No joint effusion seen. 5. As above, no evidence of osteomyelitis. 6. Large amount of associated fluid/edema within the surrounding subcutaneous soft tissues.   Treatments: antibiotics: vancomycin and oral Bactrim and surgery: I/D right elbow  Discharge Exam: Blood pressure (!) 99/58, pulse 71, temperature 98.7 F (37.1 C), temperature source Oral, resp. rate 20, height 5\' 8"  (1.727 m), weight 188 lb 9.6 oz (85.5 kg), SpO2 95 %.   Disposition: 01-Home or Self Care  Discharge Instructions    Call MD / Call 911    Complete by:  As directed    If you experience chest pain or shortness of breath, CALL 911 and be transported to the hospital emergency room.  If you develope a fever above 101 F, pus (white drainage) or increased drainage or redness at the wound, or calf pain, call your surgeon's office.   Constipation Prevention    Complete by:  As directed    Drink plenty of fluids.  Prune juice may be helpful.  You may use a stool softener, such as Colace (over the counter) 100 mg twice a day.  Use MiraLax (over the counter) for constipation as needed.   Diet - low sodium heart healthy    Complete by:  As directed  Discharge instructions    Complete by:  As directed    Take Bactrim antibiotic one twice a day  Change dressing once daily  Keep the wound clean and dry  No bath or shower   Increase activity slowly as tolerated    Complete by:  As directed      Allergies as of 05/21/2017      Reactions   Codeine    REACTION: Facial  numbness      Medication List    STOP taking these medications   ALEVE 220 MG tablet Generic drug:  naproxen sodium   cephALEXin 500 MG capsule Commonly known as:  KEFLEX     TAKE these medications   B-12 PO Take 1 tablet by mouth daily.   calcium carbonate 1250 (500 Ca) MG tablet Commonly known as:  OS-CAL - dosed in mg of elemental calcium Take 1 tablet (500 mg of elemental calcium total) by mouth 2 (two) times daily with a meal. What changed:  when to take this   carvedilol 12.5 MG tablet Commonly known as:  COREG Take 1 tablet (12.5 mg total) by mouth 2 (two) times daily with a meal. What changed:  how much to take   cholecalciferol 1000 units tablet Commonly known as:  VITAMIN D Take 1,000 Units by mouth daily.   HYDROcodone-acetaminophen 7.5-325 MG tablet Commonly known as:  NORCO Take 1 tablet by mouth every 4 (four) hours as needed for moderate pain.   magnesium oxide 400 (241.3 Mg) MG tablet Commonly known as:  MAG-OX Take 1 tablet (400 mg total) by mouth daily.   multivitamin with minerals Tabs tablet Take 1 tablet by mouth daily.   ondansetron 4 MG tablet Commonly known as:  ZOFRAN Take 4 mg by mouth every 8 (eight) hours as needed for nausea or vomiting.   sulfamethoxazole-trimethoprim 800-160 MG tablet Commonly known as:  BACTRIM DS,SEPTRA DS Take 1 tablet by mouth 2 (two) times daily.            Discharge Care Instructions        Start     Ordered   05/21/17 0000  sulfamethoxazole-trimethoprim (BACTRIM DS,SEPTRA DS) 800-160 MG tablet  2 times daily     05/21/17 0805   05/21/17 0000  HYDROcodone-acetaminophen (NORCO) 7.5-325 MG tablet  Every 4 hours PRN     05/21/17 0805   05/21/17 0000  Call MD / Call 911    Comments:  If you experience chest pain or shortness of breath, CALL 911 and be transported to the hospital emergency room.  If you develope a fever above 101 F, pus (white drainage) or increased drainage or redness at the wound, or  calf pain, call your surgeon's office.   05/21/17 0805   05/21/17 0000  Diet - low sodium heart healthy     05/21/17 0805   05/21/17 0000  Constipation Prevention    Comments:  Drink plenty of fluids.  Prune juice may be helpful.  You may use a stool softener, such as Colace (over the counter) 100 mg twice a day.  Use MiraLax (over the counter) for constipation as needed.   05/21/17 0805   05/21/17 0000  Increase activity slowly as tolerated     05/21/17 0805   05/21/17 0000  Discharge instructions    Comments:  Take Bactrim antibiotic one twice a day  Change dressing once daily  Keep the wound clean and dry  No bath or shower   05/21/17 0805  follow-up will be Friday the 28th for dressing change and wound check  Signed: Arther Abbott 05/21/2017, 8:06 AM

## 2017-05-24 LAB — CULTURE, BLOOD (ROUTINE X 2)
Culture: NO GROWTH
Culture: NO GROWTH
Special Requests: ADEQUATE
Special Requests: ADEQUATE

## 2017-05-26 LAB — AEROBIC/ANAEROBIC CULTURE W GRAM STAIN (SURGICAL/DEEP WOUND)

## 2017-05-26 LAB — AEROBIC/ANAEROBIC CULTURE (SURGICAL/DEEP WOUND): Culture: NO GROWTH

## 2017-05-28 ENCOUNTER — Encounter: Payer: Self-pay | Admitting: Orthopedic Surgery

## 2017-05-28 ENCOUNTER — Ambulatory Visit (INDEPENDENT_AMBULATORY_CARE_PROVIDER_SITE_OTHER): Payer: Self-pay | Admitting: Orthopedic Surgery

## 2017-05-28 VITALS — BP 155/97 | HR 72 | Ht 67.0 in | Wt 190.0 lb

## 2017-05-28 DIAGNOSIS — M71121 Other infective bursitis, right elbow: Secondary | ICD-10-CM

## 2017-05-28 DIAGNOSIS — Z4889 Encounter for other specified surgical aftercare: Secondary | ICD-10-CM

## 2017-05-28 MED ORDER — HYDROCODONE-ACETAMINOPHEN 5-325 MG PO TABS: 1.0000 | ORAL_TABLET | Freq: Four times a day (QID) | ORAL | 0 refills | Status: DC | PRN

## 2017-05-28 NOTE — Progress Notes (Signed)
Post op   Chief Complaint  Patient presents with  . Follow-up    Recheck on right elbow, DOS 05-20-17.    Encounter Diagnoses  Name Primary?  Marland Kitchen Aftercare following surgery Yes  . Septic olecranon bursitis, right    8 days postop incision drainage right elbow for septic olecranon bursitis  Patient is currently on Bactrim and hydrocodone. His wound looks clean he has some staples in and partially left open he says he has some pain at the olecranon bursa. The cellulitis and severe pain he was having is much better  He will follow-up in a week to have the staples out and continue the dressing changes and antibiotics.

## 2017-06-04 ENCOUNTER — Ambulatory Visit (INDEPENDENT_AMBULATORY_CARE_PROVIDER_SITE_OTHER): Payer: Self-pay | Admitting: Orthopedic Surgery

## 2017-06-04 DIAGNOSIS — Z4889 Encounter for other specified surgical aftercare: Secondary | ICD-10-CM

## 2017-06-04 DIAGNOSIS — M71121 Other infective bursitis, right elbow: Secondary | ICD-10-CM

## 2017-06-04 MED ORDER — INDOMETHACIN 25 MG PO CAPS
25.0000 mg | ORAL_CAPSULE | Freq: Two times a day (BID) | ORAL | 0 refills | Status: DC
Start: 2017-06-04 — End: 2019-11-09

## 2017-06-04 MED ORDER — DOXYCYCLINE HYCLATE 100 MG PO TABS: 100.0000 mg | ORAL_TABLET | Freq: Two times a day (BID) | ORAL | 1 refills | Status: DC

## 2017-06-04 MED ORDER — HYDROCODONE-ACETAMINOPHEN 5-325 MG PO TABS: 1.0000 | ORAL_TABLET | Freq: Four times a day (QID) | ORAL | 0 refills | Status: DC | PRN

## 2017-06-04 NOTE — Patient Instructions (Signed)
I ve changed the antibiotic and added a medication for inflammation

## 2017-06-04 NOTE — Progress Notes (Signed)
Postop visit  Status post incision and drainage right elbow this is postop day #15  The patient says he is not any better. He's been on hydrocodone and Bactrim his cultures were negative  He still says he has 9 out of 10 pain especially when bending his elbow  His wound today shows that the open area has healed up his staples are still intact we removed those. He has some mild erythema around the wound he does have painful range of motion with the elbow primarily over the olecranon bursa. He has no streaking no lymphadenopathy neurovascular exam is intact  Recommend changing his antibiotics to doxycycline. Continue his hydrocodone for pain  He either has subclinical infection or bursitis.  Follow-up one week check on results of medication change

## 2017-06-11 ENCOUNTER — Ambulatory Visit: Payer: Self-pay | Admitting: Orthopedic Surgery

## 2017-06-15 ENCOUNTER — Encounter: Payer: Self-pay | Admitting: Orthopedic Surgery

## 2017-06-15 ENCOUNTER — Ambulatory Visit (INDEPENDENT_AMBULATORY_CARE_PROVIDER_SITE_OTHER): Payer: Self-pay | Admitting: Orthopedic Surgery

## 2017-06-15 VITALS — BP 147/89 | HR 95 | Ht 68.0 in | Wt 198.0 lb

## 2017-06-15 DIAGNOSIS — Z4889 Encounter for other specified surgical aftercare: Secondary | ICD-10-CM

## 2017-06-15 DIAGNOSIS — M71121 Other infective bursitis, right elbow: Secondary | ICD-10-CM

## 2017-06-15 DIAGNOSIS — L03113 Cellulitis of right upper limb: Secondary | ICD-10-CM

## 2017-06-15 MED ORDER — HYDROCODONE-ACETAMINOPHEN 5-325 MG PO TABS: 1.0000 | ORAL_TABLET | Freq: Three times a day (TID) | ORAL | 0 refills | Status: DC | PRN

## 2017-06-15 NOTE — Progress Notes (Signed)
POST OP VISIT   Patient ID: Chris Flynn, male   DOB: 05/14/1956, 61 y.o.   MRN: [id number]  Chief Complaint  Patient presents with  . Post-op Follow-up    I&D 05/20/17 improving    Encounter Diagnoses  Name Primary?  . Cellulitis of right arm s/p I& D right elbow 05/20/17 Yes  . Aftercare following surgery   . Septic olecranon bursitis, right    [name] comes in his elbow is improving though he has intermittent good days and bad days especially when he is pushing such as closing a door with his hand in extension this is related to his triceps involvement of this infection. He can now lift his hand and the area is not having as much difficulty with sleeping he's only taking his hydrocodone once at night he has 2-3 tablets of antibiotic left  He has some tenderness but the redness is much improved the tenderness is over the medial side of the joint as well as thetriceps tendon  Encounter Diagnoses  Name Primary?  . Cellulitis of right arm s/p I& D right elbow 05/20/17 Yes  . Aftercare following surgery   . Septic olecranon bursitis, right     Recommend finishing his antibiotics he can take hydrocodone as needed once or twice a day.  Follow-up 3 weeks

## 2017-06-21 ENCOUNTER — Encounter: Payer: Self-pay | Admitting: Orthopedic Surgery

## 2017-07-05 ENCOUNTER — Encounter: Payer: Self-pay | Admitting: Orthopedic Surgery

## 2017-07-05 ENCOUNTER — Telehealth: Payer: Self-pay | Admitting: Orthopedic Surgery

## 2017-07-05 NOTE — Telephone Encounter (Signed)
Ok to call if he has a problem

## 2017-07-05 NOTE — Telephone Encounter (Signed)
Patient cancelled upcoming follow up appointment for 07/07/17, for wound check. States has just started a new job and is out of town. I offered, and encouraged re-scheduling; patient said works Monday-Friday 7am to 5pm; therefore, states he will have to call back when he is able to work it out.  He said "the wound looks like it's healed" - I again relayed that it is important that Dr Aline Brochure evaluates.  Have also sent patient a letter.

## 2017-07-07 ENCOUNTER — Ambulatory Visit: Payer: Self-pay | Admitting: Orthopedic Surgery

## 2017-07-07 NOTE — Telephone Encounter (Signed)
Noted  

## 2019-10-03 ENCOUNTER — Encounter (HOSPITAL_COMMUNITY): Payer: Self-pay | Admitting: Emergency Medicine

## 2019-10-03 ENCOUNTER — Other Ambulatory Visit: Payer: Self-pay

## 2019-10-03 ENCOUNTER — Emergency Department (HOSPITAL_COMMUNITY)
Admission: EM | Admit: 2019-10-03 | Discharge: 2019-10-03 | Disposition: A | Discharge status: Home / Self Care | Payer: No Typology Code available for payment source | Attending: Emergency Medicine | Admitting: Emergency Medicine

## 2019-10-03 ENCOUNTER — Emergency Department (HOSPITAL_COMMUNITY): Payer: No Typology Code available for payment source

## 2019-10-03 DIAGNOSIS — Y9301 Activity, walking, marching and hiking: Secondary | ICD-10-CM | POA: Insufficient documentation

## 2019-10-03 DIAGNOSIS — S0990XA Unspecified injury of head, initial encounter: Secondary | ICD-10-CM | POA: Diagnosis not present

## 2019-10-03 DIAGNOSIS — Y999 Unspecified external cause status: Secondary | ICD-10-CM | POA: Insufficient documentation

## 2019-10-03 DIAGNOSIS — Y92481 Parking lot as the place of occurrence of the external cause: Secondary | ICD-10-CM | POA: Insufficient documentation

## 2019-10-03 DIAGNOSIS — S0101XA Laceration without foreign body of scalp, initial encounter: Secondary | ICD-10-CM | POA: Insufficient documentation

## 2019-10-03 DIAGNOSIS — Z79899 Other long term (current) drug therapy: Secondary | ICD-10-CM | POA: Insufficient documentation

## 2019-10-03 DIAGNOSIS — S39012A Strain of muscle, fascia and tendon of lower back, initial encounter: Secondary | ICD-10-CM

## 2019-10-03 DIAGNOSIS — Z23 Encounter for immunization: Secondary | ICD-10-CM | POA: Diagnosis not present

## 2019-10-03 DIAGNOSIS — I10 Essential (primary) hypertension: Secondary | ICD-10-CM | POA: Diagnosis not present

## 2019-10-03 MED ORDER — LIDOCAINE HCL (PF) 2 % IJ SOLN
INTRAMUSCULAR | Status: AC
  Filled 2019-10-03: qty 20

## 2019-10-03 MED ORDER — TETANUS-DIPHTH-ACELL PERTUSSIS 5-2.5-18.5 LF-MCG/0.5 IM SUSP
0.5000 mL | Freq: Once | INTRAMUSCULAR | Status: AC
  Administered 2019-10-03: 0.5 mL via INTRAMUSCULAR
  Filled 2019-10-03: qty 0.5

## 2019-10-03 MED ORDER — ETODOLAC 300 MG PO CAPS: 300.0000 mg | ORAL_CAPSULE | Freq: Three times a day (TID) | ORAL | 0 refills | Status: DC

## 2019-10-03 MED ORDER — LIDOCAINE HCL (PF) 2 % IJ SOLN
10.0000 mL | Freq: Once | INTRAMUSCULAR | Status: AC
  Administered 2019-10-03: 10 mL

## 2019-10-03 MED ORDER — CYCLOBENZAPRINE HCL 10 MG PO TABS: 10.0000 mg | ORAL_TABLET | Freq: Two times a day (BID) | ORAL | 0 refills | Status: DC | PRN
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
## 2019-10-03 NOTE — Discharge Instructions (Signed)
Staple removal in 7 to 10 days.  Take the medications as needed for pain.

## 2019-11-08 ENCOUNTER — Encounter: Payer: Self-pay | Admitting: *Deleted

## 2019-11-08 ENCOUNTER — Other Ambulatory Visit: Payer: Self-pay | Admitting: *Deleted

## 2019-11-09 ENCOUNTER — Encounter: Payer: Self-pay | Admitting: Counselor

## 2019-11-09 ENCOUNTER — Encounter: Payer: Self-pay | Admitting: Neurology

## 2019-11-09 ENCOUNTER — Other Ambulatory Visit: Payer: Self-pay

## 2019-11-09 ENCOUNTER — Ambulatory Visit: Payer: Self-pay | Admitting: Neurology

## 2019-11-09 VITALS — BP 130/77 | HR 78 | Temp 97.8°F | Ht 68.0 in | Wt 248.0 lb

## 2019-11-09 DIAGNOSIS — X58XXXA Exposure to other specified factors, initial encounter: Secondary | ICD-10-CM | POA: Insufficient documentation

## 2019-11-09 DIAGNOSIS — R413 Other amnesia: Secondary | ICD-10-CM | POA: Insufficient documentation

## 2019-11-09 DIAGNOSIS — X58XXXD Exposure to other specified factors, subsequent encounter: Secondary | ICD-10-CM

## 2019-11-09 DIAGNOSIS — R519 Headache, unspecified: Secondary | ICD-10-CM | POA: Insufficient documentation

## 2019-11-09 DIAGNOSIS — R269 Unspecified abnormalities of gait and mobility: Secondary | ICD-10-CM

## 2019-11-09 MED ORDER — PROPRANOLOL HCL 40 MG PO TABS: 40.0000 mg | ORAL_TABLET | Freq: Two times a day (BID) | ORAL | 5 refills | Status: DC

## 2019-11-09 MED ORDER — SUMATRIPTAN SUCCINATE 25 MG PO TABS: 25.0000 mg | ORAL_TABLET | ORAL | 5 refills | Status: DC | PRN
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
## 2019-11-10 LAB — SEDIMENTATION RATE: Sed Rate: 9 mm/hr (ref 0–30)

## 2019-11-10 LAB — C-REACTIVE PROTEIN: CRP: 3 mg/L (ref 0–10)

## 2019-11-11 ENCOUNTER — Ambulatory Visit: Payer: Self-pay

## 2019-11-13 ENCOUNTER — Telehealth: Payer: Self-pay | Admitting: *Deleted

## 2019-11-13 NOTE — Telephone Encounter (Signed)
-----   Message from Marcial Pacas, MD sent at 11/13/2019  5:14 PM EDT ----- Please call patient for normal laboratory result

## 2019-11-13 NOTE — Telephone Encounter (Signed)
I spoke to the patient and informed him of the normal lab results.

## 2019-11-30 ENCOUNTER — Encounter: Payer: Self-pay | Admitting: Counselor

## 2019-11-30 ENCOUNTER — Other Ambulatory Visit: Payer: Self-pay

## 2019-11-30 ENCOUNTER — Ambulatory Visit (INDEPENDENT_AMBULATORY_CARE_PROVIDER_SITE_OTHER): Payer: Self-pay | Admitting: Counselor

## 2019-11-30 ENCOUNTER — Ambulatory Visit: Payer: Self-pay

## 2019-11-30 DIAGNOSIS — F439 Reaction to severe stress, unspecified: Secondary | ICD-10-CM

## 2019-11-30 DIAGNOSIS — F0781 Postconcussional syndrome: Secondary | ICD-10-CM
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
## 2019-11-30 NOTE — Progress Notes (Signed)
   Psychometrist Note   Cognitive testing was administered to Chris Flynn by Lamar Benes, B.S. (Technician) under the supervision of Alphonzo Severance, Psy.D., ABN. [name] was able to tolerate all test procedures. Dr. Nicole Kindred met with the patient as needed to manage any emotional reactions to the testing procedures (if applicable). Rest breaks were offered.    The battery of tests administered was selected by Dr. Nicole Kindred with consideration to the patient's current level of functioning, the nature of his symptoms, emotional and behavioral responses during the interview, level of literacy, observed level of motivation/effort, and the nature of the referral question. This battery was communicated to the psychometrist. Communication between Dr. Nicole Kindred and the psychometrist was ongoing throughout the evaluation and Dr. Nicole Kindred was immediately accessible at all times. Dr. Nicole Kindred provided supervision to the technician on the date of this service, to the extent necessary to assure the quality of all services provided.    Chris Flynn will return in approximately one week for an interactive feedback session with Dr. Nicole Kindred, at which time male test performance, clinical impressions, and treatment recommendations will be reviewed in detail. The patient understands he can contact our office should he require our assistance before this time.   A total of 100 minutes of billable time were spent with Chris Flynn by the technician, including test administration and scoring time. Billing for these services is reflected in Dr. Les Pou note.   This note reflects time spent with the psychometrician and does not include test scores, clinical history, or any interpretations made by Dr. Nicole Kindred. The full report will follow in a separate note.

## 2019-12-05 NOTE — Progress Notes (Signed)
[facility]  Patient Name: Chris Flynn MRN: [id number] Date of Birth: November 21, 1955 Age: 64 y.o. Education: 16 years  Measurement properties of test scores: IQ, Index, and Standard Scores (SS): Mean = 100; Standard Deviation = 15 Scaled Scores (Ss): Mean = 10; Standard Deviation = 3 Z scores (Z): Mean = 0; Standard Deviation = 1 T scores (T); Mean = 50; Standard Deviation = 10  TEST SCORES:    Note: This summary of test scores accompanies the interpretive report and should not be considered in isolation without reference to the appropriate sections in the text. Test scores are relative to age, gender, and educational history as available and appropriate.   Performance Validity        ACS: Raw Descriptor      Word Choice: 49 Within Expectation      The Dot Counting Test: Raw Descriptor      E-Score 8 Within Expectation      Embedded Measures: Raw Descriptor      RBANS Effort Index: 0 Within Expectation      WAIS-IV Reliable Digit Span: 9 Within Expectation      WAIS-IV Reliable Digit Span Revised 12 Within Expectation      Expected Functioning        Wide Range Achievement Test (Word Reading): Standard/Scaled Score Percentile       Word Reading 109 73      Cognitive Testing        RBANS, Form : Standard/Scaled Score Percentile  Total Score 96 39  Immediate Memory 109 73      List Learning 12 75      Story Memory 11 63  Visuospatial/Constructional 78 7      Figure Copy 8 25      Line Orientation --- 3-9  Language 101 53      Picture Naming --- 51-75      Semantic Fluency 10 50  Attention 100 50      Digit Span 12 75      Coding 8 25  Delayed Memory 102 55      List Recall --- 26-50      List Recognition --- 26-50      Story Recall 13 84      Figure Recall 11 63      Wechsler Adult Intelligence Scale - IV: Standard/Scaled Score Percentile  Working Memory Index 105 63      Digit Span 8 25          Digit Span Forward  8 25          Digit Span Backward 8 25          Digit Span Sequencing 10 50      Arithmetic 14 91  Processing Speed Index 100 50      Symbol Search 9 37      Coding 11 63      Verbal Fluency: T-score Percentile      Controlled Oral Word Association (F-A-S) 47 38      Semantic Fluency (Animals) 53 62      Trail Making Test: T-Score Percentile      Part A 40 16      Part B 59 82      Modified Wisconsin Card Sorting Test (MWCST): Standard/T-Score Percentile      Number of Categories Correct 34 6      Number of Perseverative Errors 39 14      Number  of Total Errors 39 14      Percent Perseverative Errors 48 42  Executive Function Composite 78 7      Rating Scales         Raw Score Descriptor  Patient Health Questionnaire - 9 12 Moderate Depression  GAD-7 14 Moderate Anxiety  PTSD Checklist - 5 36 Positive   Peter V. Nicole Kindred PsyD, [place] Clinical Neuropsychologist

## 2019-12-07 ENCOUNTER — Encounter: Payer: Self-pay | Admitting: Counselor

## 2019-12-07 ENCOUNTER — Other Ambulatory Visit: Payer: Self-pay

## 2019-12-07 ENCOUNTER — Ambulatory Visit (INDEPENDENT_AMBULATORY_CARE_PROVIDER_SITE_OTHER): Payer: Self-pay | Admitting: Counselor

## 2019-12-07 DIAGNOSIS — F4310 Post-traumatic stress disorder, unspecified: Secondary | ICD-10-CM

## 2019-12-07 DIAGNOSIS — G4700 Insomnia, unspecified: Secondary | ICD-10-CM

## 2019-12-07 DIAGNOSIS — F0781 Postconcussional syndrome: Secondary | ICD-10-CM

## 2019-12-07 NOTE — Patient Instructions (Signed)
Your performance and presentation on assessment today were consistent with very good test performance in many areas, including normal scores on measures of processing efficiency, attention/working memory, and immediate/delayed recall of information. This is good and suggests much cognitive recovery following your head injury. You did demonstrate a marginal finding on one measure of executive functioning although I do not think it is enough to classify your test performance as frankly impaired; rather, there is suggestion of some very mild diminishment that I would expect to improve over time.   When an individual strikes their head and experiences an alteration of consciousness or a brief loss of consciousness without complications like a brain bleed, this is called a concussion or mild traumatic brain injury. This is not a brain injury per se in terms of causing structural damage to the brain, but it does negatively impact the brain's functioning in terms of energy metabolism (i.e, it is a metabolic injury). Most people experience cognitive symptoms following a concussion, and these typically resolve within hours to days. The vast majority of individuals recover completely in a matter of time, without any measurable lasting cognitive impairments. A small minority of people may experience persisting symptoms, the so-called "postconcussion syndrome," and there is debate about why this is the case. Oftentimes, the symptoms that people report are things that happen with a relatively high frequency in the general population (e.g., things like irritability, difficulties concentrating, and headaches). These things are also associated with depression and anxiety, which are two of the biggest risk factors risk factors for postconcussion syndrome. Things like changes in routines, pain related to headaches, difficulties readjusting to work and normal activities, financial stress, depression, and anxiety about full  recovery may be more important precipitants of postconcussive symptoms than the brain injury itself.   In your case, I believe your preexisting PTSD was exacerbated and destabilized by the stress of the head injury, disruption to your routines, fear about full recovery and other psychosocial factors. This then caused you to develop further physical symptoms, affective symptoms (e.g., irritability, "personality changes") and is probably the main driver of your ongoing symptoms. Therefore, I suggest that you actively treat your trauma condition conditions with brain sparing therapies if at all possible.   We discussed a referral for psychotherapy, which might ideally help you with your sleep and other issues but eventually, trauma-focused therapy is likely to be helpful, I made a referral to [facility]. Psychotherapy is the treatment with the strongest evidence base for effectiveness in treating PTSD. I would recommend a focused treatment such as Cognitive Processing Therapy or Prolonged Exposure Therapy, both of which have been proven to be effective in numerous studies.   You are currently on the potentially brain impairing medication Clonazepam. You are also on a fairly high dose of that medication. I would recommend that you discuss alternatives with your prescribing providers, such as something in the SSRI class of medications.   There are few things as disruptive to brain functioning as not getting a good night's sleep. For sleep, I recommend against using medications, which can have lingering sedating effects on the brain and rob your brain of restful REM sleep. Instead, consider trying some of the following sleep hygiene recommendations. They may not work at once and may take effort, but the effort you spend is likely to be rewarded with better sleep eventually:   Stick to a sleep schedule of the same bedtime and wake time even on the weekends, which can help to  regulate your body's  internal clock so that you fall asleep and stay asleep.   Practice a relaxing bedtime ritual (conducted away from bright lights) which will help separate your sleep from stimulating activities and prepare your body to fall asleep when you go to bed.   Avoid naps, especially in the afternoon.   Evaluate your room and create conditions that will promote sleep such as keeping it cool (between 60 - 67 degrees), quiet, and free from any lights. Consider using blackout curtains, a white noise generator, or fan that will help mask any noises that might prevent you from going to sleep or awaken you during the night.   Sleep on a comfortable mattress and pillows.   Avoid bright light in the evening and excessive use of portable electronic devices right before bed that may contain light frequencies that can contribute to sleep problems.   Avoid alcohol, cigarettes, or heavy meals in the evening. If you must eat, consume a light snack 45 minutes before bed.   Use your bed only for sleep to strengthen the association between your bed and sleep.   If you can't go to sleep within 30 minutes, go into another room and do something relaxing until you feel tired. Then, come back and try to go to sleep again for 30 minutes and repeat until sleep is achieved.   Some people find over the counter melatonin to be helpful for sleep, which you could discuss with a pharmacist or prescribing provider.   I will send a copy of a handout regarding mTBI/Concussion to you at your home address.
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
## 2020-02-03 ENCOUNTER — Encounter (HOSPITAL_COMMUNITY): Payer: Self-pay

## 2020-02-03 ENCOUNTER — Other Ambulatory Visit: Payer: Self-pay

## 2020-02-03 ENCOUNTER — Emergency Department (HOSPITAL_COMMUNITY)
Admission: EM | Admit: 2020-02-03 | Discharge: 2020-02-03 | Disposition: A | Discharge status: Home / Self Care | Payer: No Typology Code available for payment source | Attending: Emergency Medicine | Admitting: Emergency Medicine

## 2020-02-03 ENCOUNTER — Emergency Department (HOSPITAL_COMMUNITY): Payer: No Typology Code available for payment source

## 2020-02-03 DIAGNOSIS — Z7984 Long term (current) use of oral hypoglycemic drugs: Secondary | ICD-10-CM | POA: Diagnosis not present

## 2020-02-03 DIAGNOSIS — R519 Headache, unspecified: Secondary | ICD-10-CM | POA: Diagnosis present

## 2020-02-03 DIAGNOSIS — I10 Essential (primary) hypertension: Secondary | ICD-10-CM | POA: Diagnosis not present

## 2020-02-03 DIAGNOSIS — Z79899 Other long term (current) drug therapy: Secondary | ICD-10-CM | POA: Insufficient documentation

## 2020-02-03 LAB — COMPREHENSIVE METABOLIC PANEL
ALT: 26 U/L (ref 0–44)
AST: 47 U/L — ABNORMAL HIGH (ref 15–41)
Albumin: 4.4 g/dL (ref 3.5–5.0)
Alkaline Phosphatase: 70 U/L (ref 38–126)
Anion gap: 11 (ref 5–15)
BUN: 20 mg/dL (ref 8–23)
CO2: 21 mmol/L — ABNORMAL LOW (ref 22–32)
Calcium: 8 mg/dL — ABNORMAL LOW (ref 8.9–10.3)
Chloride: 106 mmol/L (ref 98–111)
Creatinine, Ser: 0.97 mg/dL (ref 0.61–1.24)
GFR calc Af Amer: >60 mL/min (ref >60)
GFR calc non Af Amer: >60 mL/min (ref >60)
Glucose, Bld: 126 mg/dL — ABNORMAL HIGH (ref 70–99)
Potassium: 4.3 mmol/L (ref 3.5–5.1)
Sodium: 138 mmol/L (ref 135–145)
Total Bilirubin: 0.6 mg/dL (ref 0.3–1.2)
Total Protein: 7.1 g/dL (ref 6.5–8.1)

## 2020-02-03 LAB — CBC WITH DIFFERENTIAL/PLATELET
Abs Immature Granulocytes: 0.01 10*3/uL (ref 0.00–0.07)
Basophils Absolute: 0 10*3/uL (ref 0.0–0.1)
Basophils Relative: 0 %
Eosinophils Absolute: 0.2 10*3/uL (ref 0.0–0.5)
Eosinophils Relative: 4 %
HCT: 34.5 % — ABNORMAL LOW (ref 39.0–52.0)
Hemoglobin: 12.1 g/dL — ABNORMAL LOW (ref 13.0–17.0)
Immature Granulocytes: 0 %
Lymphocytes Relative: 46 %
Lymphs Abs: 2.4 10*3/uL (ref 0.7–4.0)
MCH: 36.2 pg — ABNORMAL HIGH (ref 26.0–34.0)
MCHC: 35.1 g/dL (ref 30.0–36.0)
MCV: 103.3 fL — ABNORMAL HIGH (ref 80.0–100.0)
Monocytes Absolute: 0.5 10*3/uL (ref 0.1–1.0)
Monocytes Relative: 10 %
Neutro Abs: 2.1 10*3/uL (ref 1.7–7.7)
Neutrophils Relative %: 40 %
Platelets: 137 10*3/uL — ABNORMAL LOW (ref 150–400)
RBC: 3.34 MIL/uL — ABNORMAL LOW (ref 4.22–5.81)
RDW: 14.1 % (ref 11.5–15.5)
WBC: 5.3 10*3/uL (ref 4.0–10.5)
nRBC: 0 % (ref 0.0–0.2)

## 2020-02-03 MED ORDER — ONDANSETRON HCL 4 MG/2ML IJ SOLN
4.0000 mg | Freq: Once | INTRAMUSCULAR | Status: AC
  Administered 2020-02-03: 4 mg via INTRAVENOUS
  Filled 2020-02-03: qty 2

## 2020-02-03 MED ORDER — HYDROCODONE-ACETAMINOPHEN 5-325 MG PO TABS: 1.0000 | ORAL_TABLET | Freq: Four times a day (QID) | ORAL | 0 refills | Status: DC | PRN

## 2020-02-03 MED ORDER — HYDROMORPHONE HCL 1 MG/ML IJ SOLN
1.0000 mg | Freq: Once | INTRAMUSCULAR | Status: AC
  Administered 2020-02-03: 1 mg via INTRAVENOUS
  Filled 2020-02-03: qty 1

## 2020-02-03 MED ORDER — SODIUM CHLORIDE 0.9 % IV BOLUS
500.0000 mL | Freq: Once | INTRAVENOUS | Status: AC
  Administered 2020-02-03: 500 mL via INTRAVENOUS

## 2020-02-03 MED ORDER — ONDANSETRON HCL 4 MG PO TABS
4.0000 mg | ORAL_TABLET | Freq: Four times a day (QID) | ORAL | 0 refills | Status: DC
Start: 2020-02-03 — End: 2021-08-20
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
## 2020-02-03 NOTE — Discharge Instructions (Addendum)
Follow-up with Dr. Merlene Laughter and your family doctor.
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
## 2020-02-08 ENCOUNTER — Ambulatory Visit: Payer: Self-pay | Admitting: Neurology

## 2020-04-26 ENCOUNTER — Encounter: Payer: Self-pay | Admitting: Psychology

## 2020-06-10 ENCOUNTER — Other Ambulatory Visit: Payer: Self-pay

## 2020-06-10 ENCOUNTER — Encounter: Payer: 59 | Attending: Psychology | Admitting: Psychology

## 2020-06-10 DIAGNOSIS — G44311 Acute post-traumatic headache, intractable: Secondary | ICD-10-CM | POA: Insufficient documentation

## 2020-06-10 DIAGNOSIS — R413 Other amnesia: Secondary | ICD-10-CM | POA: Insufficient documentation

## 2020-06-10 DIAGNOSIS — R519 Headache, unspecified: Secondary | ICD-10-CM | POA: Insufficient documentation

## 2020-06-10 DIAGNOSIS — R269 Unspecified abnormalities of gait and mobility: Secondary | ICD-10-CM | POA: Insufficient documentation

## 2020-06-10 DIAGNOSIS — R4789 Other speech disturbances: Secondary | ICD-10-CM | POA: Insufficient documentation
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
## 2020-06-13 ENCOUNTER — Telehealth: Payer: Self-pay

## 2020-06-13 NOTE — Telephone Encounter (Signed)
Patient is a former patient of Dr. Greer Pickerel, with the last visit being March 2021. Patient is requesting a provider switch to Dr. Jaynee Eagles. Please advise. Thank you
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
## 2020-06-25 ENCOUNTER — Other Ambulatory Visit: Payer: Self-pay | Admitting: Internal Medicine

## 2020-07-24 ENCOUNTER — Other Ambulatory Visit: Payer: Self-pay | Admitting: Internal Medicine

## 2020-07-24 ENCOUNTER — Other Ambulatory Visit (HOSPITAL_COMMUNITY): Payer: Self-pay | Admitting: Internal Medicine

## 2020-07-24 DIAGNOSIS — F0781 Postconcussional syndrome: Secondary | ICD-10-CM

## 2020-07-24 DIAGNOSIS — R519 Headache, unspecified: Secondary | ICD-10-CM

## 2020-08-09 ENCOUNTER — Other Ambulatory Visit: Payer: Self-pay

## 2020-08-09 ENCOUNTER — Ambulatory Visit (HOSPITAL_COMMUNITY)
Admission: RE | Admit: 2020-08-09 | Discharge: 2020-08-09 | Disposition: A | Discharge status: Home / Self Care | Payer: 59 | Source: Ambulatory Visit | Attending: Internal Medicine | Admitting: Internal Medicine

## 2020-08-09 DIAGNOSIS — R519 Headache, unspecified: Secondary | ICD-10-CM | POA: Insufficient documentation

## 2020-08-09 DIAGNOSIS — F0781 Postconcussional syndrome: Secondary | ICD-10-CM | POA: Insufficient documentation

## 2020-08-28 ENCOUNTER — Encounter: Payer: Self-pay | Admitting: Emergency Medicine

## 2020-08-28 ENCOUNTER — Other Ambulatory Visit: Payer: Self-pay

## 2020-08-28 ENCOUNTER — Ambulatory Visit
Admission: EM | Admit: 2020-08-28 | Discharge: 2020-08-28 | Disposition: A | Discharge status: Home / Self Care | Payer: 59 | Attending: Family Medicine | Admitting: Family Medicine

## 2020-08-28 DIAGNOSIS — M5441 Lumbago with sciatica, right side: Secondary | ICD-10-CM | POA: Diagnosis not present

## 2020-08-28 MED ORDER — DEXAMETHASONE SODIUM PHOSPHATE 10 MG/ML IJ SOLN
10.0000 mg | Freq: Once | INTRAMUSCULAR | Status: AC
  Administered 2020-08-28: 10 mg via INTRAMUSCULAR

## 2020-08-28 MED ORDER — PREDNISONE 10 MG (21) PO TBPK: ORAL_TABLET | Freq: Every day | ORAL | 0 refills | Status: AC

## 2020-08-28 MED ORDER — KETOROLAC TROMETHAMINE 30 MG/ML IJ SOLN
30.0000 mg | Freq: Once | INTRAMUSCULAR | Status: AC
  Administered 2020-08-28: 30 mg via INTRAMUSCULAR

## 2020-08-28 NOTE — Discharge Instructions (Addendum)
You have received a steroid injection in the office today  You have received an injection for pain as well today  I have sent in a prednisone taper for you to take for 6 days. 6 tablets on day one, 5 tablets on day two, 4 tablets on day three, 3 tablets on day four, 2 tablets on day five, and 1 tablet on day six.  Follow up with this office or with primary care if symptoms are persisting.  Follow up in the ER for high fever, trouble swallowing, trouble breathing, other concerning symptoms.

## 2020-08-28 NOTE — ED Provider Notes (Signed)
[place]   [id number] 08/28/20 Arrival Time: 0820  [id number] PAIN  SUBJECTIVE: History from: patient. Chris Flynn is a 64 y.o. male complains of right hip, thigh, low back pain for the last week.  Describes the pain as burning.  Reports that he has had this issue years ago and was seen by chiropractor and treated for sciatica. Has tried OTC medications without relief. Symptoms are made worse with activity. Denies fever, chills, erythema, ecchymosis, effusion, weakness, saddle paresthesias, loss of bowel or bladder function.      ROS: As per HPI.  All other pertinent ROS negative.     Past Medical History:  Diagnosis Date  . Alcohol abuse   . Dizziness   . Headache   . Hypertension   . PTSD (post-traumatic stress disorder)   . Short-term memory loss    Past Surgical History:  Procedure Laterality Date  . INCISION AND DRAINAGE ABSCESS Right 05/20/2017   Procedure: INCISION AND DRAINAGE ABSCESS right elbow;  Surgeon: Carole Civil, MD;  Location: [facility];  Service: Orthopedics;  Laterality: Right;  . NECK SURGERY     C5-6   Allergies  Allergen Reactions  . Codeine     REACTION: Facial numbness  . Serotonin Reuptake Inhibitors (Ssris)     aggreesion  . Topamax [Topiramate]     hyperactivity   No current facility-administered medications on file prior to encounter.   Current Outpatient Medications on File Prior to Encounter  Medication Sig Dispense Refill  . calcium carbonate (OS-CAL - DOSED IN MG OF ELEMENTAL CALCIUM) 1250 (500 Ca) MG tablet Take 1 tablet (500 mg of elemental calcium total) by mouth 2 (two) times daily with a meal. 30 tablet 0  . carvedilol (COREG) 6.25 MG tablet Take 6.25 mg by mouth 2 (two) times daily.    . cholecalciferol (VITAMIN D) 1000 units tablet Take 1,000 Units by mouth daily.    . clonazePAM (KLONOPIN) 1 MG tablet Take 1 mg by mouth 2 (two) times daily as needed.    . Cyanocobalamin (B-12 PO) Take 1 tablet by mouth daily.     . cyclobenzaprine (FLEXERIL) 10 MG tablet Take 1 tablet (10 mg total) by mouth 2 (two) times daily as needed for muscle spasms. (Patient not taking: Reported on 11/09/2019) 14 tablet 0  . diclofenac (VOLTAREN) 50 MG EC tablet Take 50 mg by mouth 2 (two) times daily.    Marland Kitchen etodolac (LODINE) 300 MG capsule Take 1 capsule (300 mg total) by mouth every 8 (eight) hours. (Patient not taking: Reported on 11/09/2019) 21 capsule 0  . furosemide (LASIX) 40 MG tablet Take 40 mg by mouth daily as needed.    . hydrochlorothiazide (HYDRODIURIL) 25 MG tablet Take 25 mg by mouth daily.    Marland Kitchen HYDROcodone-acetaminophen (NORCO/VICODIN) 5-325 MG tablet Take 1 tablet by mouth every 6 (six) hours as needed for moderate pain. 10 tablet 0  . losartan (COZAAR) 100 MG tablet Take 100 mg by mouth daily.    . magnesium oxide (MAG-OX) 400 (241.3 Mg) MG tablet Take 1 tablet (400 mg total) by mouth daily. 30 tablet 0  . metFORMIN (GLUCOPHAGE) 500 MG tablet Take 500 mg by mouth daily.    . methocarbamol (ROBAXIN) 750 MG tablet Take 750 mg by mouth 4 (four) times daily.    . Multiple Vitamin (MULTIVITAMIN WITH MINERALS) TABS tablet Take 1 tablet by mouth daily.    . ondansetron (ZOFRAN) 4 MG tablet Take 1 tablet (4 mg total)  by mouth every 6 (six) hours. 4 tablet 0  . propranolol (INDERAL) 40 MG tablet Take 1 tablet (40 mg total) by mouth 2 (two) times daily. 60 tablet 5  . simvastatin (ZOCOR) 20 MG tablet Take 20 mg by mouth at bedtime.    . SUMAtriptan (IMITREX) 25 MG tablet Take 1 tablet (25 mg total) by mouth every 2 (two) hours as needed for migraine. May repeat in 2 hours if headache persists or recurs. 10 tablet 5   Social History   Socioeconomic History  . Marital status: Married    Spouse name: Diane  . Number of children: Not on file  . Years of education: Not on file  . Highest education level: Bachelor's degree (e.g., BA, AB, BS)  Occupational History    Comment: retired  Tobacco Use  . Smoking status: Never  Smoker  . Smokeless tobacco: Never Used  Vaping Use  . Vaping Use: Never used  Substance and Sexual Activity  . Alcohol use: No  . Drug use: No  . Sexual activity: Yes  Other Topics Concern  . Not on file  Social History Narrative   Lives with wife   Social Determinants of Health   Financial Resource Strain: Not on file  Food Insecurity: Not on file  Transportation Needs: Not on file  Physical Activity: Not on file  Stress: Not on file  Social Connections: Not on file  Intimate Partner Violence: Not on file   Family History  Problem Relation Age of Onset  . COPD Mother   . Lung cancer Father     OBJECTIVE:  Vitals:   08/28/20 0837 08/28/20 0839  BP:  (!) 154/84  Pulse:  77  Resp:  18  Temp:  98.2 F (36.8 C)  TempSrc:  Oral  SpO2:  93%  Weight: 211 lb 10.3 oz (96 kg)   Height: 5\' 8"  (1.727 m)     General appearance: ALERT; in no acute distress.  Head: NCAT Lungs: Normal respiratory effort CV: pulses 2+ bilaterally. Cap refill < 2 seconds Musculoskeletal:  Inspection: Skin warm, dry, clear and intact without obvious erythema, effusion, or ecchymosis.  Palpation: Right low back tender to palpation, positive straight leg raise on the right side ROM: Limited ROM active and passive to low back especially with bending, twisting and position changes Skin: warm and dry Neurologic: Ambulates without difficulty; Sensation intact about the upper/ lower extremities Psychological: alert and cooperative; normal mood and affect  DIAGNOSTIC STUDIES:  No results found.   ASSESSMENT & PLAN:  1. Acute right-sided low back pain with right-sided sciatica     Meds ordered this encounter  Medications  . dexamethasone (DECADRON) injection 10 mg  . ketorolac (TORADOL) 30 MG/ML injection 30 mg  . predniSONE (STERAPRED UNI-PAK 21 TAB) 10 MG (21) TBPK tablet    Sig: Take by mouth daily for 6 days. Take 6 tablets on day 1, 5 tablets on day 2, 4 tablets on day 3, 3 tablets on  day 4, 2 tablets on day 5, 1 tablet on day 6    Dispense:  21 tablet    Refill:  0    Order Specific Question:   Supervising Provider    Answer:   Chase Picket [id number]   Decadron 10 mg IM in office today Toradol 30 mg IM in office today Prescribe steroid taper Continue conservative management of rest, ice, and gentle stretches Take ibuprofen as needed for pain relief (may cause abdominal discomfort, ulcers, and  GI bleeds avoid taking with other NSAIDs) Follow up with PCP if symptoms persist Return or go to the ER if you have any new or worsening symptoms (fever, chills, chest pain, abdominal pain, changes in bowel or bladder habits, pain radiating into lower legs)   Reviewed expectations re: course of current medical issues. Questions answered. Outlined signs and symptoms indicating need for more acute intervention. Patient verbalized understanding. After Visit Summary given.       Moshe Cipro, NP 08/28/20 [phone]
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
## 2020-09-12 ENCOUNTER — Ambulatory Visit
Admission: EM | Admit: 2020-09-12 | Discharge: 2020-09-12 | Disposition: A | Discharge status: Home / Self Care | Payer: 59 | Attending: Family Medicine | Admitting: Family Medicine

## 2020-09-12 ENCOUNTER — Encounter: Payer: Self-pay | Admitting: Emergency Medicine

## 2020-09-12 ENCOUNTER — Other Ambulatory Visit: Payer: Self-pay

## 2020-09-12 ENCOUNTER — Ambulatory Visit (INDEPENDENT_AMBULATORY_CARE_PROVIDER_SITE_OTHER): Payer: 59

## 2020-09-12 DIAGNOSIS — M25551 Pain in right hip: Secondary | ICD-10-CM

## 2020-09-12 DIAGNOSIS — M5441 Lumbago with sciatica, right side: Secondary | ICD-10-CM | POA: Diagnosis not present

## 2020-09-12 DIAGNOSIS — M5442 Lumbago with sciatica, left side: Secondary | ICD-10-CM | POA: Diagnosis not present

## 2020-09-12 MED ORDER — KETOROLAC TROMETHAMINE 10 MG PO TABS: 10.0000 mg | ORAL_TABLET | Freq: Four times a day (QID) | ORAL | 0 refills | Status: DC | PRN

## 2020-09-12 MED ORDER — KETOROLAC TROMETHAMINE 30 MG/ML IJ SOLN
30.0000 mg | Freq: Once | INTRAMUSCULAR | Status: AC
  Administered 2020-09-12: 30 mg via INTRAMUSCULAR

## 2020-09-12 MED ORDER — DEXAMETHASONE SODIUM PHOSPHATE 10 MG/ML IJ SOLN
10.0000 mg | Freq: Once | INTRAMUSCULAR | Status: AC
  Administered 2020-09-12: 10 mg via INTRAMUSCULAR

## 2020-09-12 NOTE — ED Triage Notes (Signed)
Patient states he is having sciatic pain on right side.  Has been having pain for a month.  Was seen here back in December for same and states the pain is still there and he can barely walk.

## 2020-09-12 NOTE — ED Provider Notes (Signed)
[place]   [id number] 09/12/20 Arrival Time: [id number]  [id number] PAIN  SUBJECTIVE: History from: patient. Chris Flynn is a 65 y.o. male complains of right low back pain with right-sided sciatica that began about 3 weeks ago.  Was seen in this office on 08/28/2020.  Was given Toradol and Decadron injections.  Was also prescribed a steroid taper.  Reports that pain is still there and it is affecting how he is walking. Symptoms are made worse with activity. Reports that he did go see a chiropractor and was not pleased with the office, so he did not actually get an adjustment.  Denies fever, chills, erythema, ecchymosis, effusion, weakness, numbness and tingling, saddle paresthesias, loss of bowel or bladder function.     ROS: As per HPI.  All other pertinent ROS negative.     Past Medical History:  Diagnosis Date  . Alcohol abuse   . Dizziness   . Headache   . Hypertension   . PTSD (post-traumatic stress disorder)   . Short-term memory loss    Past Surgical History:  Procedure Laterality Date  . INCISION AND DRAINAGE ABSCESS Right 05/20/2017   Procedure: INCISION AND DRAINAGE ABSCESS right elbow;  Surgeon: Carole Civil, MD;  Location: [facility];  Service: Orthopedics;  Laterality: Right;  . NECK SURGERY     C5-6   Allergies  Allergen Reactions  . Codeine     REACTION: Facial numbness  . Serotonin Reuptake Inhibitors (Ssris)     aggreesion  . Topamax [Topiramate]     hyperactivity   No current facility-administered medications on file prior to encounter.   Current Outpatient Medications on File Prior to Encounter  Medication Sig Dispense Refill  . calcium carbonate (OS-CAL - DOSED IN MG OF ELEMENTAL CALCIUM) 1250 (500 Ca) MG tablet Take 1 tablet (500 mg of elemental calcium total) by mouth 2 (two) times daily with a meal. 30 tablet 0  . carvedilol (COREG) 6.25 MG tablet Take 6.25 mg by mouth 2 (two) times daily.    . cholecalciferol (VITAMIN D) 1000 units tablet  Take 1,000 Units by mouth daily.    . clonazePAM (KLONOPIN) 1 MG tablet Take 1 mg by mouth 2 (two) times daily as needed.    . Cyanocobalamin (B-12 PO) Take 1 tablet by mouth daily.    . cyclobenzaprine (FLEXERIL) 10 MG tablet Take 1 tablet (10 mg total) by mouth 2 (two) times daily as needed for muscle spasms. (Patient not taking: Reported on 11/09/2019) 14 tablet 0  . diclofenac (VOLTAREN) 50 MG EC tablet Take 50 mg by mouth 2 (two) times daily.    Marland Kitchen etodolac (LODINE) 300 MG capsule Take 1 capsule (300 mg total) by mouth every 8 (eight) hours. (Patient not taking: Reported on 11/09/2019) 21 capsule 0  . furosemide (LASIX) 40 MG tablet Take 40 mg by mouth daily as needed.    . hydrochlorothiazide (HYDRODIURIL) 25 MG tablet Take 25 mg by mouth daily.    Marland Kitchen HYDROcodone-acetaminophen (NORCO/VICODIN) 5-325 MG tablet Take 1 tablet by mouth every 6 (six) hours as needed for moderate pain. 10 tablet 0  . losartan (COZAAR) 100 MG tablet Take 100 mg by mouth daily.    . magnesium oxide (MAG-OX) 400 (241.3 Mg) MG tablet Take 1 tablet (400 mg total) by mouth daily. 30 tablet 0  . metFORMIN (GLUCOPHAGE) 500 MG tablet Take 500 mg by mouth daily.    . methocarbamol (ROBAXIN) 750 MG tablet Take 750 mg by mouth 4 (  four) times daily.    . Multiple Vitamin (MULTIVITAMIN WITH MINERALS) TABS tablet Take 1 tablet by mouth daily.    . ondansetron (ZOFRAN) 4 MG tablet Take 1 tablet (4 mg total) by mouth every 6 (six) hours. 4 tablet 0  . propranolol (INDERAL) 40 MG tablet Take 1 tablet (40 mg total) by mouth 2 (two) times daily. 60 tablet 5  . simvastatin (ZOCOR) 20 MG tablet Take 20 mg by mouth at bedtime.    . SUMAtriptan (IMITREX) 25 MG tablet Take 1 tablet (25 mg total) by mouth every 2 (two) hours as needed for migraine. May repeat in 2 hours if headache persists or recurs. 10 tablet 5   Social History   Socioeconomic History  . Marital status: Married    Spouse name: Diane  . Number of children: Not on file  .  Years of education: Not on file  . Highest education level: Bachelor's degree (e.g., BA, AB, BS)  Occupational History    Comment: retired  Tobacco Use  . Smoking status: Never Smoker  . Smokeless tobacco: Never Used  Vaping Use  . Vaping Use: Never used  Substance and Sexual Activity  . Alcohol use: No  . Drug use: No  . Sexual activity: Yes  Other Topics Concern  . Not on file  Social History Narrative   Lives with wife   Social Determinants of Health   Financial Resource Strain: Not on file  Food Insecurity: Not on file  Transportation Needs: Not on file  Physical Activity: Not on file  Stress: Not on file  Social Connections: Not on file  Intimate Partner Violence: Not on file   Family History  Problem Relation Age of Onset  . COPD Mother   . Lung cancer Father     OBJECTIVE:  Vitals:   09/12/20 0915 09/12/20 0922  BP: (!) 171/98   Pulse: 77   Resp: 16   Temp: 98.2 F (36.8 C)   TempSrc: Oral   SpO2: 95%   Weight:  230 lb (104.3 kg)  Height:  5\' 7"  (1.702 m)    General appearance: ALERT; in no acute distress.  Head: NCAT Lungs: Normal respiratory effort CV:  pulses 2+ bilaterally. Cap refill < 2 seconds Musculoskeletal:  Inspection: Skin warm, dry, clear and intact without obvious erythema, effusion, or ecchymosis.  Palpation: Right low back tender to palpation, R greater trochanter TTP ROM: Right low back ROM active and passive, positive right straight leg raise Skin: warm and dry Neurologic: Ambulates without difficulty; Sensation intact about the upper/ lower extremities Psychological: alert and cooperative; normal mood and affect  DIAGNOSTIC STUDIES:  No results found.   ASSESSMENT & PLAN:  1. Acute right-sided low back pain with bilateral sciatica   2. Right hip pain     Meds ordered this encounter  Medications  . ketorolac (TORADOL) 30 MG/ML injection 30 mg  . dexamethasone (DECADRON) injection 10 mg  . ketorolac (TORADOL) 10 MG  tablet    Sig: Take 1 tablet (10 mg total) by mouth every 6 (six) hours as needed.    Dispense:  30 tablet    Refill:  0    Order Specific Question:   Supervising Provider    Answer:   Chase Picket [[id number]]    Xray today shows degenerative changes to lumbar spine and both hips  Decadron 10mg  IM in office today  Toradol 30mg  IM in office today Will swap meloxicam for po toradol x 1 week,  then back to meloxicam Ambulatory referral placed to [facility] today as well for follow up Return or go to the ER if you have any new or worsening symptoms (fever, chills, chest pain, abdominal pain, changes in bowel or bladder habits, pain radiating into lower legs)   Reviewed expectations re: course of current medical issues. Questions answered. Outlined signs and symptoms indicating need for more acute intervention. Patient verbalized understanding. After Visit Summary given.       Faustino Congress, NP 09/14/20 [phone]

## 2020-09-12 NOTE — Discharge Instructions (Addendum)
Your x-ray shows arthritis and degenerative changes.  You received Toradol and a steroid injection in the office today.  I have sent in Toradol for you to take orally 1 tablet every 6 hours as needed for pain and inflammation.  Do not take diclofenac with this medication.  I have also attached information for Ortho care in [place] if things are not improving, I would have you follow-up with them.

## 2020-10-07 ENCOUNTER — Encounter: Payer: Self-pay | Admitting: Orthopedic Surgery

## 2020-10-07 ENCOUNTER — Ambulatory Visit: Payer: 59

## 2020-10-07 ENCOUNTER — Other Ambulatory Visit: Payer: Self-pay

## 2020-10-07 ENCOUNTER — Ambulatory Visit (INDEPENDENT_AMBULATORY_CARE_PROVIDER_SITE_OTHER): Payer: 59 | Admitting: Orthopedic Surgery

## 2020-10-07 VITALS — BP 146/81 | HR 72 | Ht 68.0 in | Wt 223.2 lb

## 2020-10-07 DIAGNOSIS — M545 Low back pain, unspecified: Secondary | ICD-10-CM

## 2020-10-07 DIAGNOSIS — M5126 Other intervertebral disc displacement, lumbar region: Secondary | ICD-10-CM

## 2020-10-07 MED ORDER — GABAPENTIN 400 MG PO CAPS: 400.0000 mg | ORAL_CAPSULE | Freq: Three times a day (TID) | ORAL | 1 refills | Status: DC

## 2020-10-07 NOTE — Addendum Note (Signed)
Addended by: Dessie Coma on: 10/07/2020 02:33 PM   Modules accepted: Orders
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
## 2020-10-07 NOTE — Patient Instructions (Addendum)
We are scheduled for MRI of your lumbar spine  I have increased her gabapentin to 400 mg 3 times a day it is okay to continue taking your hydrocodone and Robaxin   Herniated Disk  A herniated disk happens when a disk in the spine bulges out too far. There is an oval disk between each pair of bones (vertebrae) in the backbone or spine. The disks connect the bones, help the spine move, and keep the bones from rubbing against each other when you move. A herniated disk can happen anywhere in the back or neck area. It most often affects the lower back. What are the causes? This condition may be caused by:  Wear and tear as you age.  Sudden injury, such as a strain or sprain. What increases the risk? The following factors may make you more likely to develop this condition:  Age. The older you are the higher the risk.  Being a man who is 43-22 years old.  Doing activities that involve heavy lifting, bending, or twisting.  Not getting enough exercise.  Being overweight.  Smoking or using tobacco. What are the signs or symptoms? Symptoms may vary depending on where the herniated disk is in your body.  Sharp pain in the arm, hip, butt, or in the lower back. The pain can spread to the leg and foot.  Dizziness.  A feeling that things are moving when they are not (vertigo).  Pain or weakness in the neck, shoulder, upper or lower arm, or fingers.  Muscle weakness. You may not be able to: ? Lift your arm or leg. ? Stand on your toes. ? Squeeze with your hands.  Loss of feeling (numbness) or tingling in the hands, arms, feet, or legs.  Being unable to control when to poop or pee. This is rare but serious. How is this treated? This condition may be treated with:  Resting for a few days or several weeks. ? Do not do things that need a lot of effort. Do not go into complete bed rest. Do only light activities. ? If you have a herniated disk in your lower back, limit how much you sit.  Sitting puts more pressure on the disk.  Medicines for pain, swelling, or tense muscles.  Ice or heat therapy.  Steroid shots. These can reduce pain and swelling.  Physical therapy to strengthen your back. Follow these instructions at home: Medicines  Take over-the-counter and prescription medicines only as told by your doctor.  If told, take steps to prevent problems with pooping (constipation). You may need to: ? Drink enough fluid to keep your pee (urine) pale yellow. ? Take over-the-counter or prescription medicines. ? Eat foods that are high in fiber. These include beans, whole grains, and fresh fruits and vegetables. ? Limit foods that are high in fat and processed sugars. These include fried or sweet foods.  Ask your doctor if you should avoid driving or using machines while you are taking your medicines. Managing pain, stiffness, and swelling  If told, put ice on the affected area. To do this: ? Put ice in a plastic bag. ? Place a towel between your skin and the bag. ? Leave the ice on for 20 minutes, 2-3 times a day.  If told, put heat on the painful area. Use the heat source that your doctor recommends, such as a moist heat pack or a heating pad. ? Place a towel between your skin and the heat source. ? Leave the heat on  for 20-30 minutes. Take off the heat or ice if your skin turns bright red. If you cannot feel pain, heat, or cold, you have a greater risk of getting burned.      Activity  Rest as told by your doctor. Avoid strict bed rest. Do only activities that do not cause pain.  After your rest period: ? Return to your normal activities as told by your doctor. Slowly start doing exercises as told. Ask your doctor what activities and exercises are safe for you. ? Use good posture. ? Avoid movements that cause pain. ? Do not lift anything that is heavier than 10 lb (4.5 kg), or the limit that you are told. ? Do not sit or stand for a long time without  moving. ? Do not sit for a long time without getting up and moving around.  If exercises (physical therapy) were prescribed, do them as told by your doctor.  Try to strengthen your back and belly with exercises such as swimming or walking. General instructions  Do not smoke or use any products that contain nicotine or tobacco. If you need help quitting, ask your doctor.  Do not wear high-heeled shoes.  Do not sleep on your belly.  If you are overweight, work with your doctor to lose weight safely.  Keep all follow-up visits. How is this prevented?  Stay at a healthy weight.  Stay in shape. Do at least 150 minutes of moderate-intensity exercise each week, such as fast walking or water aerobics.  When lifting objects: ? Keep your feet as far apart as your shoulders or farther apart. ? Tighten your belly muscles. ? Bend your knees and hips, and keep your spine neutral. Lift using the strength of your legs, not your back. Do not lock your knees straight out. ? Always ask for help to lift heavy or large objects. Contact a doctor if:  You have back pain or neck pain that does not get better after 6 weeks.  You have very bad pain in your back, neck, legs, or arms.  You get any of these problems in any part of your body: ? Tingling. ? Weakness. ? Loss of feeling. Get help right away if:  You cannot move your arms or legs.  You cannot control when you pee or poop.  You feel dizzy.  You faint.  You have trouble breathing. These symptoms may be an emergency. Get help right away. Call your local emergency services (911 in the [place]  Do not wait to see if the symptoms will go away.  Do not drive yourself to the hospital. Summary  A herniated disk happens when a disk in your backbone bulges out too far.  This condition may be caused by wear and tear as you age or a sudden injury.  Symptoms may vary depending on where your herniated disk is in your body.  Treatment may  include rest, medicines, ice or heat therapy, steroid shots, and exercises. This information is not intended to replace advice given to you by your health care provider. Make sure you discuss any questions you have with your health care provider. Document Revised: 12/06/2019 Document Reviewed: 12/06/2019 Elsevier Patient Education  2021 [place].  Degenerative Disk Disease  Degenerative disk disease is a condition caused by changes that occur in the spinal disks as a person ages. Spinal disks are soft and compressible disks located between the bones of your spine (vertebrae). These disks act like shock absorbers. Degenerative disk disease  can affect the whole spine. However, the neck and lower back are most often affected. Many changes can occur in the spinal disks with aging, such as:  The spinal disks may dry and shrink.  Small tears may occur in the tough, outer covering of the disk (annulus).  The disk space may become smaller due to loss of water.  Abnormal growths in the bone (spurs) may occur. This can put pressure on the nerve roots exiting the spinal canal, causing pain.  The spinal canal may become narrowed. What are the causes? This condition may be caused by:  Normal degeneration with age.  Injuries.  Certain activities and sports that cause damage. What increases the risk? The following factors may make you more likely to develop this condition:  Being overweight.  Having a family history of degenerative disk disease.  Smoking and use of products that contain nicotine and tobacco.  Sudden injury.  Doing work that requires heavy lifting. What are the signs or symptoms? Symptoms of this condition include:  Pain that varies in intensity. Some people have no pain, while others have severe pain. The location of the pain depends on the part of your backbone that is affected. You may have: ? Pain in your neck or arm if a disk in your neck area is  affected. ? Pain in your back, buttocks, or legs if a disk in your lower back is affected.  Pain that becomes worse while bending or reaching up, or with twisting movements.  Pain that may start gradually and worsen as time passes. It may also start after a major or minor injury.  Numbness or tingling in the arms or legs. How is this diagnosed? This condition may be diagnosed based on:  Your symptoms and medical history.  A physical exam.  Imaging tests, including: ? X-ray of the spine. ? CT scan. ? MRI. How is this treated? This condition may be treated with:  Medicines.  Injection of steroids into the back.  Rehabilitation exercises. These activities aim to strengthen muscles in your back and abdomen to better support your spine. If treatments do not help to relieve your symptoms or you have severe pain, you may need surgery. Follow these instructions at home: Medicines  Take over-the-counter and prescription medicines only as told by your health care provider.  Ask your health care provider if the medicine prescribed to you: ? Requires you to avoid driving or using machinery. ? Can cause constipation. You may need to take these actions to prevent or treat constipation:  Drink enough fluid to keep your urine pale yellow.  Take over-the-counter or prescription medicines.  Eat foods that are high in fiber, such as beans, whole grains, and fresh fruits and vegetables.  Limit foods that are high in fat and processed sugars, such as fried or sweet foods. Activity  Rest as told by your health care provider.  Avoid sitting for a long time without moving. Get up to take short walks every 1-2 hours. This is important to improve blood flow and breathing. Ask for help if you feel weak or unsteady.  Return to your normal activities as told by your health care provider. Ask your health care provider what activities are safe for you.  Perform relaxation exercises as told by  your health care provider.  Maintain good posture.  Do not lift anything that is heavier than 10 lb (4.5 kg), or the limit that you are told, until your health care provider says that  it is safe.  Follow proper lifting and walking techniques as told by your health care provider. Managing pain, stiffness, and swelling  If directed, put ice on the painful area. Icing can help to relieve pain. To do this: ? Put ice in a plastic bag. ? Place a towel between your skin and the bag. ? Leave the ice on for 20 minutes, 2-3 times a day. ? Remove the ice if your skin turns bright red. This is very important. If you cannot feel pain, heat, or cold, you have a greater risk of damage to the area.  If directed, apply heat to the painful area as often as told by your health care provider. Heat can reduce the stiffness of your muscles. Use the heat source that your health care provider recommends, such as a moist heat pack or a heating pad. ? Place a towel between your skin and the heat source. ? Leave the heat on for 20-30 minutes. ? Remove the heat if your skin turns bright red. This is especially important if you are unable to feel pain, heat, or cold. You may have a greater risk of getting burned.      General instructions  Change your sitting, standing, and sleeping habits as told by your health care provider.  Avoid sitting in the same position for long periods of time. Change positions frequently.  Lose weight or maintain a healthy weight as told by your health care provider.  Do not use any products that contain nicotine or tobacco, such as cigarettes, e-cigarettes, and chewing tobacco. If you need help quitting, ask your health care provider.  Wear supportive footwear.  Keep all follow-up visits. This is important. This may include visits for physical therapy. Contact a health care provider if you:  Have pain that does not go away within 1-4 weeks.  Lose your appetite.  Lose weight  without trying. Get help right away if you:  Have severe pain.  Notice weakness in your arms, hands, or legs.  Begin to lose control of your bladder or bowel movements.  Have fevers or night sweats. Summary  Degenerative disk disease is a condition caused by changes that occur in the spinal disks as a person ages.  This condition can affect the whole spine. However, the neck and lower back are most often affected.  Take over-the-counter and prescription medicines only as told by your health care provider. This information is not intended to replace advice given to you by your health care provider. Make sure you discuss any questions you have with your health care provider. Document Revised: 11/30/2019 Document Reviewed: 11/30/2019 Elsevier Patient Education  2021 [employer].

## 2020-10-14 ENCOUNTER — Telehealth: Payer: Self-pay | Admitting: Orthopedic Surgery

## 2020-10-14 ENCOUNTER — Telehealth: Payer: Self-pay | Admitting: Radiology

## 2020-10-14 NOTE — Telephone Encounter (Signed)
Done

## 2020-10-14 NOTE — Telephone Encounter (Addendum)
He states his pain has increased and Gabapentin not helping wants to know if you want to increase again or change to something else  MRI scheduled for next week, also made follow up visit to review MRI

## 2020-10-14 NOTE — Telephone Encounter (Signed)
Mr. Benally states he would like for you to call him when you get a moment.  He states he has a question regarding the Gabapentin.  Thanks so much

## 2020-10-14 NOTE — Telephone Encounter (Signed)
I cant complete MRI request on [facility] do not have access have requested cant call during clinic Sent fax, but received fax back has to go through [facility]   Can you help with this one?

## 2020-10-15 ENCOUNTER — Other Ambulatory Visit: Payer: Self-pay | Admitting: Orthopedic Surgery

## 2020-10-15 MED ORDER — GABAPENTIN 400 MG PO CAPS
400.0000 mg | ORAL_CAPSULE | Freq: Four times a day (QID) | ORAL | 1 refills | Status: DC
Start: 2020-10-15 — End: 2020-11-04

## 2020-10-15 NOTE — Telephone Encounter (Signed)
I can increase it or add prednisone or nsaid (no opioids)

## 2020-10-15 NOTE — Telephone Encounter (Signed)
He wants you to increase it

## 2020-10-22 ENCOUNTER — Ambulatory Visit (HOSPITAL_COMMUNITY)
Admission: RE | Admit: 2020-10-22 | Discharge: 2020-10-22 | Disposition: A | Discharge status: Home / Self Care | Payer: 59 | Source: Ambulatory Visit | Attending: Orthopedic Surgery | Admitting: Orthopedic Surgery

## 2020-10-22 DIAGNOSIS — M5126 Other intervertebral disc displacement, lumbar region: Secondary | ICD-10-CM | POA: Diagnosis present

## 2020-10-22 DIAGNOSIS — M545 Low back pain, unspecified: Secondary | ICD-10-CM | POA: Diagnosis not present

## 2020-10-23 ENCOUNTER — Encounter: Payer: Self-pay | Admitting: Orthopedic Surgery

## 2020-10-23 ENCOUNTER — Ambulatory Visit (INDEPENDENT_AMBULATORY_CARE_PROVIDER_SITE_OTHER): Payer: 59 | Admitting: Orthopedic Surgery

## 2020-10-23 ENCOUNTER — Other Ambulatory Visit: Payer: Self-pay

## 2020-10-23 VITALS — BP 135/72 | HR 82 | Ht 68.0 in | Wt 226.2 lb

## 2020-10-23 DIAGNOSIS — M5126 Other intervertebral disc displacement, lumbar region: Secondary | ICD-10-CM

## 2020-10-23 DIAGNOSIS — M48061 Spinal stenosis, lumbar region without neurogenic claudication: Secondary | ICD-10-CM | POA: Diagnosis not present

## 2020-10-23 NOTE — Patient Instructions (Addendum)
Spinal Stenosis  Spinal stenosis is a condition that happens when the spinal canal narrows. The spinal canal is the space between the bones of your spine (vertebrae). This narrowing puts pressure on the spinal cord or nerves. Spinal stenosis can affect the vertebrae in the neck, upper back, and lower back. This condition can range from mild to severe. In some cases, there are no symptoms. What are the causes? This condition is caused by areas of bone pushing into the spinal canal. This condition may be present at birth (congenital), or it may be caused by:  Slow breakdown of your vertebrae (spinal degeneration). This usually starts around 65 years of age.  Injury (trauma) to your spine.  Tumors in your spine.  Calcium deposits in your spine. What increases the risk? The following factors may make you more likely to develop this condition:  Being older than age 34.  Having a problem present at birth with an abnormally shaped spine (congenitalspinal deformity), such as scoliosis.  Having arthritis. What are the signs or symptoms? Symptoms of this condition include:  Pain in the neck or back that is generally worse with activities, particularly when you stand or walk.  Numbness, tingling, hot or cold sensations, weakness, or tiredness (fatigue) in your leg or legs.  Pain going from the buttock, down the thigh, and to the calf (sciatica). This can happen in one or both legs.  Frequent episodes of falling.  A foot-slapping gait that leads to muscle weakness. In more severe cases, you may develop:  Problems having a bowel movement or urinating.  Difficulty having sex.  Loss of feeling in your legs and inability to walk. Symptoms may come on slowly and get worse over time. In some cases, there are no symptoms. How is this diagnosed? This condition is diagnosed based on your medical history and a physical exam. You will also have tests, such as an MRI, a CT scan, or an  X-ray. How is this treated? Treatment for this condition often focuses on managing your pain and any other symptoms. Treatment may include:  Practicing good posture to lessen pressure on your nerves.  Exercising to strengthen muscles, build endurance, improve balance, and maintain range of motion. This may include physical therapy to restore movement and strength to your back.  Losing weight, if needed.  Medicines to reduce inflammation or pain. This may include a medicine that is injected into your spine (steroidinjection).  Assistive devices, such as a corset or brace. In some cases, surgery may be needed. The most common procedure is decompression laminectomy. This is done to remove excess bone that puts pressure on your nerve roots. Follow these instructions at home: Managing pain, stiffness, and swelling  Practice good posture. If you were given a brace or a corset, wear it as told by your health care provider.  Maintain a healthy weight. Talk with your health care provider if you need help losing weight.  If directed, apply heat to the affected area as often as told by your health care provider. Use the heat source that your health care provider recommends, such as a moist heat pack or a heating pad. ? Place a towel between your skin and the heat source. ? Leave the heat on for 20-30 minutes. ? Remove the heat if your skin turns bright red. This is especially important if you are unable to feel pain, heat, or cold. You may have a greater risk of getting burned.   Activity  Do all exercises  and stretches as told by your health care provider.  Do not do any activities that cause pain. Ask your health care provider what activities are safe for you.  Do not lift anything that is heavier than 10 lb (4.5 kg), or the limit that you are told by your health care provider.  Return to your normal activities as told by your health care provider. Ask your health care provider what  activities are safe for you. General instructions  Take over-the-counter and prescription medicines only as told by your health care provider.  Do not use any products that contain nicotine or tobacco, such as cigarettes, e-cigarettes, and chewing tobacco. If you need help quitting, ask your health care provider.  Eat a healthy diet. This includes plenty of fruits and vegetables, whole grains, and low-fat (lean) protein.  Keep all follow-up visits as told by your health care provider. This is important. Contact a health care provider if:  Your symptoms do not get better or they get worse.  You have a fever. Get help right away if:  You have new pain or symptoms of severe pain, such as: ? New or worsening pain in your neck or upper back. ? Severe pain that cannot be controlled with medicines. ? A severe headache that gets worse when you stand.  You are dizzy.  You have vision problems, such as blurred vision or double vision.  You have nausea or you vomit.  You develop new or worsening numbness or tingling in your back or legs.  You have pain, redness, swelling, or warmth in your arm or leg. Summary  Spinal stenosis is a condition that happens when the spinal canal narrows. The spinal canal is the space between the bones of your spine (vertebrae). This narrowing puts pressure on the spinal cord or nerves.  This condition may be caused by a birth defect, breakdown of your vertebrae, trauma, tumors, or calcium deposits.  Spinal stenosis can cause numbness, weakness, or pain in the buttocks, neck, back, and legs.  This condition is usually diagnosed with your medical history, a physical exam, and tests, such as an MRI, a CT scan, or an X-ray. This information is not intended to replace advice given to you by your health care provider. Make sure you discuss any questions you have with your health care provider. Document Revised: 06/15/2019 Document Reviewed: 06/15/2019 Elsevier  Patient Education  2021 [employer].

## 2020-10-23 NOTE — Addendum Note (Signed)
Addended by: Elizabeth Sauer on: 10/23/2020 09:04 AM   Modules accepted: Orders
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
## 2020-10-25 ENCOUNTER — Other Ambulatory Visit: Payer: Self-pay | Admitting: Internal Medicine

## 2020-10-25 ENCOUNTER — Other Ambulatory Visit (HOSPITAL_COMMUNITY): Payer: Self-pay | Admitting: Internal Medicine

## 2020-10-25 DIAGNOSIS — I672 Cerebral atherosclerosis: Secondary | ICD-10-CM

## 2020-10-31 ENCOUNTER — Encounter (HOSPITAL_COMMUNITY): Payer: Self-pay

## 2020-10-31 ENCOUNTER — Ambulatory Visit (HOSPITAL_COMMUNITY): Payer: No Typology Code available for payment source

## 2020-11-04 ENCOUNTER — Other Ambulatory Visit: Payer: Self-pay | Admitting: Orthopedic Surgery

## 2020-11-05 ENCOUNTER — Other Ambulatory Visit: Payer: Self-pay | Admitting: Orthopedic Surgery

## 2020-12-25 ENCOUNTER — Other Ambulatory Visit (HOSPITAL_COMMUNITY): Payer: Self-pay | Admitting: Neurological Surgery

## 2020-12-25 DIAGNOSIS — M5416 Radiculopathy, lumbar region: Secondary | ICD-10-CM

## 2020-12-26 ENCOUNTER — Ambulatory Visit (HOSPITAL_COMMUNITY)
Admission: RE | Admit: 2020-12-26 | Discharge: 2020-12-26 | Disposition: A | Discharge status: Home / Self Care | Payer: 59 | Source: Ambulatory Visit | Attending: Neurological Surgery | Admitting: Neurological Surgery

## 2020-12-26 DIAGNOSIS — M5416 Radiculopathy, lumbar region: Secondary | ICD-10-CM | POA: Diagnosis present

## 2021-04-29 ENCOUNTER — Encounter (INDEPENDENT_AMBULATORY_CARE_PROVIDER_SITE_OTHER): Payer: Self-pay | Admitting: *Deleted

## 2021-07-31 ENCOUNTER — Ambulatory Visit (INDEPENDENT_AMBULATORY_CARE_PROVIDER_SITE_OTHER): Payer: HMO | Admitting: Gastroenterology

## 2021-07-31 ENCOUNTER — Encounter (INDEPENDENT_AMBULATORY_CARE_PROVIDER_SITE_OTHER): Payer: Self-pay

## 2021-07-31 ENCOUNTER — Other Ambulatory Visit: Payer: Self-pay

## 2021-07-31 ENCOUNTER — Other Ambulatory Visit (INDEPENDENT_AMBULATORY_CARE_PROVIDER_SITE_OTHER): Payer: Self-pay

## 2021-07-31 ENCOUNTER — Encounter (INDEPENDENT_AMBULATORY_CARE_PROVIDER_SITE_OTHER): Payer: Self-pay | Admitting: Gastroenterology

## 2021-07-31 VITALS — BP 130/79 | HR 66 | Temp 98.3°F | Ht 68.0 in | Wt 208.8 lb

## 2021-07-31 DIAGNOSIS — F1010 Alcohol abuse, uncomplicated: Secondary | ICD-10-CM | POA: Insufficient documentation

## 2021-07-31 DIAGNOSIS — R195 Other fecal abnormalities: Secondary | ICD-10-CM | POA: Diagnosis not present
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
## 2021-07-31 NOTE — Patient Instructions (Signed)
It was nice to meet you today! We will get you scheduled for your colonoscopy for further evaluation since you had the positive cologuard.  Please let me know if you develop any rectal bleeding or black stools.

## 2021-08-12 ENCOUNTER — Telehealth (INDEPENDENT_AMBULATORY_CARE_PROVIDER_SITE_OTHER): Payer: Self-pay

## 2021-08-12 MED ORDER — PEG 3350-KCL-NA BICARB-NACL 420 G PO SOLR: 4000.0000 mL | ORAL | 0 refills | Status: DC

## 2021-08-12 NOTE — Telephone Encounter (Signed)
Chris Flynn, CMA  

## 2021-08-26 NOTE — Patient Instructions (Signed)
Chris Flynn  08/26/2021     @[email or web address] @   Your procedure is scheduled on  09/03/2021.   Report to [facility] at  0730  A.M.   Call this number if you have problems the morning of surgery:  [phone]   Remember:  Follow the diet and prep instructions given to you by the office.    DO NOT take any medications for diabetes the moring of your procedure.    Take these medicines the morning of surgery with A SIP OF WATER      carvedilol, clonazepam, gabapentin, hydrocodone (if needed), mobic(if needed), robaxin (if needed).     Do not wear jewelry, make-up or nail polish.  Do not wear lotions, powders, or perfumes, or deodorant.  Do not shave 48 hours prior to surgery.  Men may shave face and neck.  Do not bring valuables to the hospital.  [facility] is not responsible for any belongings or valuables.  Contacts, dentures or bridgework may not be worn into surgery.  Leave your suitcase in the car.  After surgery it may be brought to your room.  For patients admitted to the hospital, discharge time will be determined by your treatment team.  Patients discharged the day of surgery will not be allowed to drive home and must  have someone with them for 24 hours.    Special instructions:   DO NOT smoke tobacco or vape for 24 hours before your procedure.  Please read over the following fact sheets that you were given. Anesthesia Post-op Instructions and Care and Recovery After Surgery      Colonoscopy, Adult, Care After This sheet gives you information about how to care for yourself after your procedure. Your health care provider may also give you more specific instructions. If you have problems or questions, contact your health care provider. What can I expect after the procedure? After the procedure, it is common to have: A small amount of blood in your stool for 24 hours after the procedure. Some gas. Mild cramping or bloating of your  abdomen. Follow these instructions at home: Eating and drinking  Drink enough fluid to keep your urine pale yellow. Follow instructions from your health care provider about eating or drinking restrictions. Resume your normal diet as instructed by your health care provider. Avoid heavy or fried foods that are hard to digest. Activity Rest as told by your health care provider. Avoid sitting for a long time without moving. Get up to take short walks every 1-2 hours. This is important to improve blood flow and breathing. Ask for help if you feel weak or unsteady. Return to your normal activities as told by your health care provider. Ask your health care provider what activities are safe for you. Managing cramping and bloating  Try walking around when you have cramps or feel bloated. Apply heat to your abdomen as told by your health care provider. Use the heat source that your health care provider recommends, such as a moist heat pack or a heating pad. Place a towel between your skin and the heat source. Leave the heat on for 20-30 minutes. Remove the heat if your skin turns bright red. This is especially important if you are unable to feel pain, heat, or cold. You may have a greater risk of getting burned. General instructions If you were given a sedative during the procedure, it can affect you for several hours. Do not drive or operate machinery  until your health care provider says that it is safe. For the first 24 hours after the procedure: Do not sign important documents. Do not drink alcohol. Do your regular daily activities at a slower pace than normal. Eat soft foods that are easy to digest. Take over-the-counter and prescription medicines only as told by your health care provider. Keep all follow-up visits as told by your health care provider. This is important. Contact a health care provider if: You have blood in your stool 2-3 days after the procedure. Get help right away if you  have: More than a small spotting of blood in your stool. Large blood clots in your stool. Swelling of your abdomen. Nausea or vomiting. A fever. Increasing pain in your abdomen that is not relieved with medicine. Summary After the procedure, it is common to have a small amount of blood in your stool. You may also have mild cramping and bloating of your abdomen. If you were given a sedative during the procedure, it can affect you for several hours. Do not drive or operate machinery until your health care provider says that it is safe. Get help right away if you have a lot of blood in your stool, nausea or vomiting, a fever, or increased pain in your abdomen. This information is not intended to replace advice given to you by your health care provider. Make sure you discuss any questions you have with your health care provider. Document Revised: 06/23/2019 Document Reviewed: 03/13/2019 Elsevier Patient Education  [place] After This sheet gives you information about how to care for yourself after your procedure. Your health care provider may also give you more specific instructions. If you have problems or questions, contact your health care provider. What can I expect after the procedure? After the procedure, it is common to have: Tiredness. Forgetfulness about what happened after the procedure. Impaired judgment for important decisions. Nausea or vomiting. Some difficulty with balance. Follow these instructions at home: For the time period you were told by your health care provider:   Rest as needed. Do not participate in activities where you could fall or become injured. Do not drive or use machinery. Do not drink alcohol. Do not take sleeping pills or medicines that cause drowsiness. Do not make important decisions or sign legal documents. Do not take care of children on your own. Eating and drinking Follow the diet that is recommended by  your health care provider. Drink enough fluid to keep your urine pale yellow. If you vomit: Drink water, juice, or soup when you can drink without vomiting. Make sure you have little or no nausea before eating solid foods. General instructions Have a responsible adult stay with you for the time you are told. It is important to have someone help care for you until you are awake and alert. Take over-the-counter and prescription medicines only as told by your health care provider. If you have sleep apnea, surgery and certain medicines can increase your risk for breathing problems. Follow instructions from your health care provider about wearing your sleep device: Anytime you are sleeping, including during daytime naps. While taking prescription pain medicines, sleeping medicines, or medicines that make you drowsy. Avoid smoking. Keep all follow-up visits as told by your health care provider. This is important. Contact a health care provider if: You keep feeling nauseous or you keep vomiting. You feel light-headed. You are still sleepy or having trouble with balance after 24 hours. You develop a  rash. You have a fever. You have redness or swelling around the IV site. Get help right away if: You have trouble breathing. You have new-onset confusion at home. Summary For several hours after your procedure, you may feel tired. You may also be forgetful and have poor judgment. Have a responsible adult stay with you for the time you are told. It is important to have someone help care for you until you are awake and alert. Rest as told. Do not drive or operate machinery. Do not drink alcohol or take sleeping pills. Get help right away if you have trouble breathing, or if you suddenly become confused. This information is not intended to replace advice given to you by your health care provider. Make sure you discuss any questions you have with your health care provider. Document Revised: 05/02/2020  Document Reviewed: 07/20/2019 Elsevier Patient Education  2022 [employer].

## 2021-08-29 ENCOUNTER — Other Ambulatory Visit: Payer: Self-pay

## 2021-08-29 ENCOUNTER — Encounter (HOSPITAL_COMMUNITY): Payer: Self-pay

## 2021-08-29 ENCOUNTER — Encounter (HOSPITAL_COMMUNITY)
Admission: RE | Admit: 2021-08-29 | Discharge: 2021-08-29 | Disposition: A | Discharge status: Home / Self Care | Payer: HMO | Source: Ambulatory Visit | Attending: Internal Medicine | Admitting: Internal Medicine

## 2021-08-29 VITALS — BP 109/65 | HR 64 | Temp 97.9°F | Resp 18 | Ht 68.0 in | Wt 208.0 lb

## 2021-08-29 DIAGNOSIS — Z01818 Encounter for other preprocedural examination: Secondary | ICD-10-CM | POA: Diagnosis not present

## 2021-08-29 DIAGNOSIS — D696 Thrombocytopenia, unspecified: Secondary | ICD-10-CM | POA: Diagnosis not present

## 2021-08-29 DIAGNOSIS — E119 Type 2 diabetes mellitus without complications: Secondary | ICD-10-CM | POA: Diagnosis not present

## 2021-08-29 DIAGNOSIS — R195 Other fecal abnormalities: Secondary | ICD-10-CM | POA: Insufficient documentation

## 2021-08-29 LAB — BASIC METABOLIC PANEL
Anion gap: 8 (ref 5–15)
BUN: 15 mg/dL (ref 8–23)
CO2: 25 mmol/L (ref 22–32)
Calcium: 9.3 mg/dL (ref 8.9–10.3)
Chloride: 106 mmol/L (ref 98–111)
Creatinine, Ser: 0.97 mg/dL (ref 0.61–1.24)
GFR, Estimated: >60 mL/min (ref >60)
Glucose, Bld: 104 mg/dL — ABNORMAL HIGH (ref 70–99)
Potassium: 4.1 mmol/L (ref 3.5–5.1)
Sodium: 139 mmol/L (ref 135–145)

## 2021-08-29 LAB — CBC WITH DIFFERENTIAL/PLATELET
Abs Immature Granulocytes: 0.02 10*3/uL (ref 0.00–0.07)
Basophils Absolute: 0 10*3/uL (ref 0.0–0.1)
Basophils Relative: 1 %
Eosinophils Absolute: 0.4 10*3/uL (ref 0.0–0.5)
Eosinophils Relative: 6 %
HCT: 44.6 % (ref 39.0–52.0)
Hemoglobin: 15.3 g/dL (ref 13.0–17.0)
Immature Granulocytes: 0 %
Lymphocytes Relative: 25 %
Lymphs Abs: 1.7 10*3/uL (ref 0.7–4.0)
MCH: 33 pg (ref 26.0–34.0)
MCHC: 34.3 g/dL (ref 30.0–36.0)
MCV: 96.1 fL (ref 80.0–100.0)
Monocytes Absolute: 0.7 10*3/uL (ref 0.1–1.0)
Monocytes Relative: 10 %
Neutro Abs: 3.8 10*3/uL (ref 1.7–7.7)
Neutrophils Relative %: 58 %
Platelets: 183 10*3/uL (ref 150–400)
RBC: 4.64 MIL/uL (ref 4.22–5.81)
RDW: 12.5 % (ref 11.5–15.5)
WBC: 6.6 10*3/uL (ref 4.0–10.5)
nRBC: 0 % (ref 0.0–0.2)

## 2021-09-03 ENCOUNTER — Encounter (HOSPITAL_COMMUNITY): Payer: Self-pay | Admitting: Internal Medicine

## 2021-09-03 ENCOUNTER — Ambulatory Visit (HOSPITAL_COMMUNITY)
Admission: RE | Admit: 2021-09-03 | Discharge: 2021-09-03 | Disposition: A | Discharge status: Home / Self Care | Payer: HMO | Attending: Internal Medicine | Admitting: Internal Medicine

## 2021-09-03 ENCOUNTER — Other Ambulatory Visit: Payer: Self-pay

## 2021-09-03 ENCOUNTER — Ambulatory Visit (HOSPITAL_COMMUNITY): Payer: HMO | Admitting: Anesthesiology

## 2021-09-03 ENCOUNTER — Encounter (HOSPITAL_COMMUNITY)
Admission: RE | Disposition: A | Discharge status: Home / Self Care | Payer: Self-pay | Source: Home / Self Care | Attending: Internal Medicine

## 2021-09-03 DIAGNOSIS — K644 Residual hemorrhoidal skin tags: Secondary | ICD-10-CM | POA: Diagnosis not present

## 2021-09-03 DIAGNOSIS — R195 Other fecal abnormalities: Secondary | ICD-10-CM | POA: Diagnosis not present

## 2021-09-03 DIAGNOSIS — K5730 Diverticulosis of large intestine without perforation or abscess without bleeding: Secondary | ICD-10-CM

## 2021-09-03 DIAGNOSIS — I10 Essential (primary) hypertension: Secondary | ICD-10-CM | POA: Insufficient documentation

## 2021-09-03 DIAGNOSIS — K635 Polyp of colon: Secondary | ICD-10-CM | POA: Diagnosis not present

## 2021-09-03 DIAGNOSIS — E119 Type 2 diabetes mellitus without complications: Secondary | ICD-10-CM | POA: Diagnosis not present

## 2021-09-03 DIAGNOSIS — F419 Anxiety disorder, unspecified: Secondary | ICD-10-CM | POA: Insufficient documentation

## 2021-09-03 DIAGNOSIS — R519 Headache, unspecified: Secondary | ICD-10-CM | POA: Insufficient documentation

## 2021-09-03 DIAGNOSIS — K648 Other hemorrhoids: Secondary | ICD-10-CM | POA: Diagnosis not present

## 2021-09-03 DIAGNOSIS — D123 Benign neoplasm of transverse colon: Secondary | ICD-10-CM | POA: Diagnosis not present

## 2021-09-03 DIAGNOSIS — Z1211 Encounter for screening for malignant neoplasm of colon: Secondary | ICD-10-CM | POA: Insufficient documentation

## 2021-09-03 HISTORY — PX: COLONOSCOPY WITH PROPOFOL: SHX5780

## 2021-09-03 HISTORY — PX: POLYPECTOMY: SHX5525

## 2021-09-03 LAB — GLUCOSE, CAPILLARY: Glucose-Capillary: 95 mg/dL (ref 70–99)

## 2021-09-03 LAB — HM COLONOSCOPY

## 2021-09-03 SURGERY — COLONOSCOPY WITH PROPOFOL
Anesthesia: General

## 2021-09-03 MED ORDER — PHENYLEPHRINE 40 MCG/ML (10ML) SYRINGE FOR IV PUSH (FOR BLOOD PRESSURE SUPPORT)
PREFILLED_SYRINGE | INTRAVENOUS | Status: AC
  Filled 2021-09-03: qty 10

## 2021-09-03 MED ORDER — LACTATED RINGERS IV SOLN: INTRAVENOUS | Status: DC

## 2021-09-03 MED ORDER — PHENYLEPHRINE HCL (PRESSORS) 10 MG/ML IV SOLN
INTRAVENOUS | Status: DC | PRN
  Administered 2021-09-03 (×4): 100 ug via INTRAVENOUS

## 2021-09-03 MED ORDER — PROPOFOL 500 MG/50ML IV EMUL
INTRAVENOUS | Status: DC | PRN
  Administered 2021-09-03: 150 ug/kg/min via INTRAVENOUS

## 2021-09-03 MED ORDER — PROPOFOL 1000 MG/100ML IV EMUL
INTRAVENOUS | Status: AC
  Filled 2021-09-03: qty 100

## 2021-09-03 MED ORDER — PROPOFOL 10 MG/ML IV BOLUS
INTRAVENOUS | Status: DC | PRN
  Administered 2021-09-03: 60 mg via INTRAVENOUS

## 2021-09-03 MED ORDER — LACTATED RINGERS IV SOLN: INTRAVENOUS | Status: DC | PRN

## 2021-09-03 NOTE — H&P (Signed)
Chris Flynn is an 66 y.o. male.   Chief Complaint: Patient is here for colonoscopy HPI: Patient is 66 year old Caucasian male who had Cologuard test as a screening tool for colorectal carcinoma and it came back positive.  He is therefore here for diagnostic colonoscopy.  He denies abdominal pain change in bowel habits rectal bleeding or melena.  He has good appetite and his weight is stable.  His last colonoscopy was probably 20 years ago and was normal exam. Family history is negative for CRC. He is on full dose aspirin and last dose was 3 days ago.  He is also on meloxicam.  Past Medical History:  Diagnosis Date   Diabetes ([place])    Dizziness    Headache    History of alcohol abuse    sober x4 years   Hypertension    Positive colorectal cancer screening using Cologuard test    Post concussive syndrome    PTSD (post-traumatic stress disorder)    Short-term memory loss     Past Surgical History:  Procedure Laterality Date   BACK SURGERY     5/22 8/22   INCISION AND DRAINAGE ABSCESS Right 05/20/2017   Procedure: INCISION AND DRAINAGE ABSCESS right elbow;  Surgeon: Carole Civil, MD;  Location: [facility];  Service: Orthopedics;  Laterality: Right;   NECK SURGERY     C5-6    Family History  Problem Relation Age of Onset   COPD Mother    Lung cancer Father    Social History:  reports that he has never smoked. He has never used smokeless tobacco. He reports that he does not drink alcohol and does not use drugs.  Allergies:  Allergies  Allergen Reactions   Codeine     Facial numbness   Serotonin Reuptake Inhibitors (Ssris)     aggreesion   Topamax [Topiramate]     hyperactivity    Medications Prior to Admission  Medication Sig Dispense Refill   aspirin 325 MG tablet Take 325 mg by mouth daily.     atorvastatin (LIPITOR) 40 MG tablet Take 40 mg by mouth daily.     carvedilol (COREG) 6.25 MG tablet Take 6.25 mg by mouth 2 (two) times daily.     Cholecalciferol  (DIALYVITE VITAMIN D 5000) 125 MCG (5000 UT) capsule Take 5,000 Units by mouth daily.     clonazePAM (KLONOPIN) 1 MG tablet Take 1 mg by mouth 2 (two) times daily.     Cyanocobalamin (B-12) 3000 MCG CAPS Take 3,000 mcg by mouth daily.     gabapentin (NEURONTIN) 600 MG tablet Take 600 mg by mouth 4 (four) times daily.     HYDROcodone-acetaminophen (NORCO/VICODIN) 5-325 MG tablet Take 1 tablet by mouth every 6 (six) hours as needed for moderate pain. (Patient taking differently: Take 1 tablet by mouth every 8 (eight) hours as needed for moderate pain.) 10 tablet 0   losartan (COZAAR) 100 MG tablet Take 100 mg by mouth daily.     meloxicam (MOBIC) 15 MG tablet Take 15 mg by mouth daily.     metFORMIN (GLUCOPHAGE-XR) 500 MG 24 hr tablet Take 500 mg by mouth 2 (two) times daily.     methocarbamol (ROBAXIN) 750 MG tablet Take 750 mg by mouth 4 (four) times daily.     oxymetazoline (AFRIN) 0.05 % nasal spray Place 1 spray into both nostrils 2 (two) times daily as needed for congestion.     polyethylene glycol-electrolytes (TRILYTE) 420 g solution Take 4,000 mLs by mouth  as directed. 4000 mL 0    Results for orders placed or performed during the hospital encounter of 09/03/21 (from the past 48 hour(s))  Glucose, capillary     Status: None   Collection Time: 09/03/21  7:40 AM  Result Value Ref Range   Glucose-Capillary 95 70 - 99 mg/dL    Comment: Glucose reference range applies only to samples taken after fasting for at least 8 hours.   No results found.  Review of Systems  Blood pressure 122/65, pulse 69, temperature 98 F (36.7 C), temperature source Oral, resp. rate 18, SpO2 94 %. Physical Exam HENT:     Mouth/Throat:     Mouth: Mucous membranes are moist.     Pharynx: Oropharynx is clear.  Eyes:     General: No scleral icterus.    Conjunctiva/sclera: Conjunctivae normal.  Cardiovascular:     Rate and Rhythm: Normal rate and regular rhythm.     Heart sounds: Normal heart sounds. No  murmur heard. Pulmonary:     Effort: Pulmonary effort is normal.     Breath sounds: Normal breath sounds.  Abdominal:     General: There is no distension.     Palpations: Abdomen is soft. There is no mass.     Tenderness: There is no abdominal tenderness.  Musculoskeletal:        General: No swelling.     Cervical back: Neck supple.  Lymphadenopathy:     Cervical: No cervical adenopathy.  Skin:    General: Skin is warm and dry.  Neurological:     Mental Status: He is alert.     Assessment/Plan  Positive Cologuard test. Diagnostic colonoscopy.  Hildred Laser, MD 09/03/2021, 9:04 AM

## 2021-09-03 NOTE — Op Note (Signed)
[facility] ([facility]) Patient Name: Chris Flynn Procedure Date: 09/03/2021 8:50 AM MRN: [id number] Date of Birth: Dec 25, 1955 Attending MD: Hildred Laser , MD CSN: [id number] Age: 66 Admit Type: Outpatient Procedure:                Colonoscopy Indications:              Positive Cologuard test Providers:                Hildred Laser, MD, Charlsie Quest. Theda Sers RN, RN,                            Raphael Gibney, Technician Referring MD:             Jannette Fogo, NP Medicines:                Propofol per Anesthesia Complications:            No immediate complications. Estimated Blood Loss:     Estimated blood loss was minimal. Procedure:                Pre-Anesthesia Assessment:                           - Prior to the procedure, a History and Physical                            was performed, and patient medications and                            allergies were reviewed. The patient's tolerance of                            previous anesthesia was also reviewed. The risks                            and benefits of the procedure and the sedation                            options and risks were discussed with the patient.                            All questions were answered, and informed consent                            was obtained. Prior Anticoagulants: The patient has                            taken no previous anticoagulant or antiplatelet                            agents except for aspirin and has taken no previous                            anticoagulant or antiplatelet agents except for  NSAID medication. ASA Grade Assessment: II - A                            patient with mild systemic disease. After reviewing                            the risks and benefits, the patient was deemed in                            satisfactory condition to undergo the procedure.                           After obtaining informed consent, the colonoscope                             was passed under direct vision. Throughout the                            procedure, the patient's blood pressure, pulse, and                            oxygen saturations were monitored continuously. The                            [id number]-HQ190L ([id number]) scope was introduced through                            the anus and advanced to the the cecum, identified                            by appendiceal orifice and ileocecal valve. The                            colonoscopy was performed without difficulty. The                            patient tolerated the procedure well. The quality                            of the bowel preparation was good. The ileocecal                            valve, appendiceal orifice, and rectum were                            photographed. Scope In: 9:13:33 AM Scope Out: 9:33:06 AM Scope Withdrawal Time: 0 hours 13 minutes 47 seconds  Total Procedure Duration: 0 hours 19 minutes 33 seconds  Findings:      The perianal and digital rectal examinations were normal.      A 4 mm polyp was found in the splenic flexure. The polyp was removed       with a cold snare. Resection and retrieval were complete.      Scattered diverticula were found in the sigmoid colon  and descending       colon.      External hemorrhoids were found during retroflexion. The hemorrhoids       were small. Impression:               - One 4 mm polyp at the splenic flexure, removed                            with a cold snare. Resected and retrieved.                           - Diverticulosis in the sigmoid colon and in the                            descending colon.                           - External hemorrhoids. Moderate Sedation:      Per Anesthesia Care Recommendation:           - Patient has a contact number available for                            emergencies. The signs and symptoms of potential                            delayed complications were discussed with the                             patient. Return to normal activities tomorrow.                            Written discharge instructions were provided to the                            patient.                           - High fiber diet today.                           - Continue present medications.                           - No aspirin, ibuprofen, naproxen, or other                            non-steroidal anti-inflammatory drugs for 1 day.                           - Await pathology results.                           - Repeat colonoscopy is recommended. The                            colonoscopy date will be determined after pathology  results from today's exam become available for                            review. Procedure Code(s):        --- Professional ---                           [phone], Colonoscopy, flexible; with removal of                            tumor(s), polyp(s), or other lesion(s) by snare                            technique Diagnosis Code(s):        --- Professional ---                           K63.5, Polyp of colon                           K64.4, Residual hemorrhoidal skin tags                           R19.5, Other fecal abnormalities                           K57.30, Diverticulosis of large intestine without                            perforation or abscess without bleeding CPT copyright 2019 American Medical Association. All rights reserved. The codes documented in this report are preliminary and upon coder review may  be revised to meet current compliance requirements. Hildred Laser, MD Hildred Laser, MD 09/03/2021 9:42:41 AM This report has been signed electronically. Number of Addenda: 0

## 2021-09-03 NOTE — Discharge Instructions (Addendum)
Resume aspirin and meloxicam on 09/04/2021 Resume other medications as before. High-fiber diet. No driving for 24 hours. Physician will call with biopsy results.

## 2021-09-03 NOTE — Anesthesia Postprocedure Evaluation (Signed)
Anesthesia Post Note  Patient: CLEARANCE CHENAULT  Procedure(s) Performed: COLONOSCOPY WITH PROPOFOL POLYPECTOMY  Patient location during evaluation: Phase II Anesthesia Type: General Level of consciousness: awake Pain management: pain level controlled Vital Signs Assessment: post-procedure vital signs reviewed and stable Respiratory status: spontaneous breathing and respiratory function stable Cardiovascular status: blood pressure returned to baseline and stable Postop Assessment: no headache and no apparent nausea or vomiting Anesthetic complications: no Comments: Late entry   No notable events documented.   Last Vitals:  Vitals:   09/03/21 0753 09/03/21 0939  BP: 122/65 108/67  Pulse: 69 73  Resp: 18 16  Temp: 36.7 C 36.4 C  SpO2: 94% 94%    Last Pain:  Vitals:   09/03/21 0939  TempSrc: Oral  PainSc: [place]

## 2021-09-03 NOTE — Anesthesia Preprocedure Evaluation (Signed)
Anesthesia Evaluation  Patient identified by MRN, date of birth, ID band Patient awake    Reviewed: Allergy & Precautions, H&P , NPO status , Patient's Chart, lab work & pertinent test results, reviewed documented beta blocker date and time   Airway Mallampati: II  TM Distance: >3 FB Neck ROM: full    Dental no notable dental hx.    Pulmonary neg pulmonary ROS,    Pulmonary exam normal breath sounds clear to auscultation       Cardiovascular Exercise Tolerance: Good hypertension, negative cardio ROS   Rhythm:regular Rate:Normal     Neuro/Psych  Headaches, PSYCHIATRIC DISORDERS Anxiety  Neuromuscular disease    GI/Hepatic negative GI ROS, Neg liver ROS,   Endo/Other  negative endocrine ROSdiabetes  Renal/GU   negative genitourinary   Musculoskeletal   Abdominal   Peds  Hematology negative hematology ROS (+)   Anesthesia Other Findings   Reproductive/Obstetrics negative OB ROS                             Anesthesia Physical Anesthesia Plan  ASA: 2  Anesthesia Plan: General   Post-op Pain Management:    Induction:   PONV Risk Score and Plan: Propofol infusion  Airway Management Planned:   Additional Equipment:   Intra-op Plan:   Post-operative Plan:   Informed Consent: I have reviewed the patients History and Physical, chart, labs and discussed the procedure including the risks, benefits and alternatives for the proposed anesthesia with the patient or authorized representative who has indicated his/her understanding and acceptance.     Dental Advisory Given  Plan Discussed with: CRNA  Anesthesia Plan Comments:         Anesthesia Quick Evaluation

## 2021-09-03 NOTE — Transfer of Care (Signed)
Immediate Anesthesia Transfer of Care Note  Patient: Chris Flynn  Procedure(s) Performed: COLONOSCOPY WITH PROPOFOL POLYPECTOMY  Patient Location: PACU  Anesthesia Type:General  Level of Consciousness: awake, alert , oriented and patient cooperative  Airway & Oxygen Therapy: Patient Spontanous Breathing  Post-op Assessment: Report given to RN, Post -op Vital signs reviewed and stable and Patient moving all extremities X 4  Post vital signs: Reviewed and stable  Last Vitals:  Vitals Value Taken Time  BP    Temp    Pulse    Resp    SpO2      Last Pain:  Vitals:   09/03/21 0855  TempSrc:   PainSc: 3          Complications: No notable events documented.

## 2021-09-05 LAB — SURGICAL PATHOLOGY

## 2021-09-09 ENCOUNTER — Encounter (HOSPITAL_COMMUNITY): Payer: Self-pay | Admitting: Internal Medicine

## 2021-09-09 ENCOUNTER — Encounter (INDEPENDENT_AMBULATORY_CARE_PROVIDER_SITE_OTHER): Payer: Self-pay | Admitting: *Deleted

## 2021-09-11 ENCOUNTER — Ambulatory Visit (INDEPENDENT_AMBULATORY_CARE_PROVIDER_SITE_OTHER): Payer: 59 | Admitting: Gastroenterology

## 2021-10-09 ENCOUNTER — Other Ambulatory Visit: Payer: Self-pay | Admitting: Neurological Surgery

## 2021-10-09 ENCOUNTER — Other Ambulatory Visit (HOSPITAL_COMMUNITY): Payer: Self-pay | Admitting: Neurological Surgery

## 2021-10-09 DIAGNOSIS — M5416 Radiculopathy, lumbar region: Secondary | ICD-10-CM

## 2021-10-22 ENCOUNTER — Other Ambulatory Visit: Payer: Self-pay

## 2021-10-22 ENCOUNTER — Ambulatory Visit (HOSPITAL_COMMUNITY)
Admission: RE | Admit: 2021-10-22 | Discharge: 2021-10-22 | Disposition: A | Discharge status: Home / Self Care | Payer: HMO | Source: Ambulatory Visit | Attending: Neurological Surgery | Admitting: Neurological Surgery

## 2021-10-22 DIAGNOSIS — M5416 Radiculopathy, lumbar region: Secondary | ICD-10-CM | POA: Diagnosis not present

## 2021-11-10 ENCOUNTER — Other Ambulatory Visit: Payer: Self-pay | Admitting: Neurological Surgery

## 2021-12-02 NOTE — Pre-Procedure Instructions (Signed)
Surgical Instructions ? ? ? Your procedure is scheduled on Monday, April 17th. ? Report to [facility] Main Entrance "A" at 10:00 A.M., then check in with the Admitting office. ? Call this number if you have problems the morning of surgery: ? [phone] ? ? If you have any questions prior to your surgery date call [phone]: Open Monday-Friday 8am-4pm ? ? ? Remember: ? Do not eat after midnight the night before your surgery ? ?You may drink clear liquids until 9:00 a.m. the morning of your surgery.   ?Clear liquids allowed are: Water, Non-Citrus Juices (without pulp), Carbonated Beverages, Clear Tea, Black Coffee ONLY (NO MILK, CREAM OR POWDERED CREAMER of any kind), and Gatorade ?  ? Take these medicines the morning of surgery with A SIP OF WATER:  ?atorvastatin (LIPITOR) ?carvedilol (COREG)  ?clonazePAM (KLONOPIN)  ?gabapentin (NEURONTIN)  ?methocarbamol (ROBAXIN) ?terbinafine (LAMISIL) ? ?As needed: ?HYDROcodone-acetaminophen (NORCO/VICODIN) ?oxymetazoline Melissa Montane)  ? ?Follow your surgeon's instructions on when to stop Aspirin.  If no instructions were given by your surgeon then you will need to call the office to get those instructions.   ? ?As of today, STOP taking any Aleve, Naproxen, Ibuprofen, Motrin, Advil, Goody's, BC's, all herbal medications, fish oil, and all vitamins. ? ?WHAT DO I DO ABOUT MY DIABETES MEDICATION? ? ? ?Do not take metFORMIN (GLUCOPHAGE-XR) the morning of surgery. ? ?The day of surgery, do not take other diabetes injectables, including Trulicity (dulaglutide). ? ? ?HOW TO MANAGE YOUR DIABETES ?BEFORE AND AFTER SURGERY ? ?Why is it important to control my blood sugar before and after surgery? ?Improving blood sugar levels before and after surgery helps healing and can limit problems. ?A way of improving blood sugar control is eating a healthy diet by: ? Eating less sugar and carbohydrates ? Increasing activity/exercise ? Talking with your doctor about reaching your blood sugar  goals ?High blood sugars (greater than 180 mg/dL) can raise your risk of infections and slow your recovery, so you will need to focus on controlling your diabetes during the weeks before surgery. ?Make sure that the doctor who takes care of your diabetes knows about your planned surgery including the date and location. ? ?How do I manage my blood sugar before surgery? ?Check your blood sugar at least 4 times a day, starting 2 days before surgery, to make sure that the level is not too high or low. ? ?Check your blood sugar the morning of your surgery when you wake up and every 2 hours until you get to the Short Stay unit. ? ?If your blood sugar is less than 70 mg/dL, you will need to treat for low blood sugar: ?Do not take insulin. ?Treat a low blood sugar (less than 70 mg/dL) with ? cup of clear juice (cranberry or apple), 4 glucose tablets, OR glucose gel. ?Recheck blood sugar in 15 minutes after treatment (to make sure it is greater than 70 mg/dL). If your blood sugar is not greater than 70 mg/dL on recheck, call [phone] for further instructions. ?Report your blood sugar to the short stay nurse when you get to Short Stay. ? ?If you are admitted to the hospital after surgery: ?Your blood sugar will be checked by the staff and you will probably be given insulin after surgery (instead of oral diabetes medicines) to make sure you have good blood sugar levels. ?The goal for blood sugar control after surgery is 80-180 mg/dL. ? ? ?         ?Do not wear  jewelry ?Do not wear lotions, powders, colognes, or deodorant. ?Men may shave face and neck. ?Do not bring valuables to the hospital. ? ?[facility] is not responsible for any belongings or valuables. .  ? ?Do NOT Smoke (Tobacco/Vaping)  24 hours prior to your procedure ? ?If you use a CPAP at night, you may bring your mask for your overnight stay. ?  ?Contacts, glasses, hearing aids, dentures or partials may not be worn into surgery, please bring cases for these  belongings ?  ?For patients admitted to the hospital, discharge time will be determined by your treatment team. ?  ?Patients discharged the day of surgery will not be allowed to drive home, and someone needs to stay with them for 24 hours. ? ? ?SURGICAL WAITING ROOM VISITATION ?Patients having surgery or a procedure in a hospital may have two support people. ?Children under the age of 52 must have an adult with them who is not the patient. ?They may stay in the waiting area during the procedure and may switch out with other visitors. If the patient needs to stay at the hospital during part of their recovery, the visitor guidelines for inpatient rooms apply. ? ?Please refer to the [facility] website for the visitor guidelines for Inpatients (after your surgery is over and you are in a regular room).  ? ? ? ? ? ?Special instructions:   ? ?Oral Hygiene is also important to reduce your risk of infection.  Remember - BRUSH YOUR TEETH THE MORNING OF SURGERY WITH YOUR REGULAR TOOTHPASTE ? ? ?[facility]- Preparing For Surgery ? ?Before surgery, you can play an important role. Because skin is not sterile, your skin needs to be as free of germs as possible. You can reduce the number of germs on your skin by washing with CHG (chlorahexidine gluconate) Soap before surgery.  CHG is an antiseptic cleaner which kills germs and bonds with the skin to continue killing germs even after washing.   ? ? ?Please do not use if you have an allergy to CHG or antibacterial soaps. If your skin becomes reddened/irritated stop using the CHG.  ?Do not shave (including legs and underarms) for at least 48 hours prior to first CHG shower. It is OK to shave your face. ? ?Please follow these instructions carefully. ?  ? ? Shower the NIGHT BEFORE SURGERY and the MORNING OF SURGERY with CHG Soap.  ? If you chose to wash your hair, wash your hair first as usual with your normal shampoo. After you shampoo, rinse your hair and body thoroughly to remove  the shampoo.  Then [employer] and genitals (private parts) with your normal soap and rinse thoroughly to remove soap. ? ?After that Use CHG Soap as you would any other liquid soap. You can apply CHG directly to the skin and wash gently with a scrungie or a clean washcloth.  ? ?Apply the CHG Soap to your body ONLY FROM THE NECK DOWN.  Do not use on open wounds or open sores. Avoid contact with your eyes, ears, mouth and genitals (private parts). Wash Face and genitals (private parts)  with your normal soap.  ? ?Wash thoroughly, paying special attention to the area where your surgery will be performed. ? ?Thoroughly rinse your body with warm water from the neck down. ? ?DO NOT shower/wash with your normal soap after using and rinsing off the CHG Soap. ? ?Pat yourself dry with a CLEAN TOWEL. ? ?Wear CLEAN PAJAMAS to bed the night before  surgery ? ?Place CLEAN SHEETS on your bed the night before your surgery ? ?DO NOT SLEEP WITH PETS. ? ? ?Day of Surgery: ? ?Take a shower with CHG soap. ?Wear Clean/Comfortable clothing the morning of surgery ?Do not apply any deodorants/lotions.   ?Remember to brush your teeth WITH YOUR REGULAR TOOTHPASTE. ? ? ? ?If you received a COVID test during your pre-op visit  it is requested that you wear a mask when out in public, stay away from anyone that may not be feeling well and notify your surgeon if you develop symptoms. If you have been in contact with anyone that has tested positive in the last 10 days please notify you surgeon. ? ?  ?Please read over the following fact sheets that you were given.  ? ?

## 2021-12-03 ENCOUNTER — Other Ambulatory Visit: Payer: Self-pay

## 2021-12-03 ENCOUNTER — Encounter (HOSPITAL_COMMUNITY): Payer: Self-pay

## 2021-12-03 ENCOUNTER — Encounter (HOSPITAL_COMMUNITY)
Admission: RE | Admit: 2021-12-03 | Discharge: 2021-12-03 | Disposition: A | Discharge status: Home / Self Care | Payer: HMO | Source: Ambulatory Visit | Attending: Neurological Surgery | Admitting: Neurological Surgery

## 2021-12-03 VITALS — BP 132/75 | HR 66 | Temp 98.2°F | Resp 18 | Ht 67.0 in | Wt 218.0 lb

## 2021-12-03 DIAGNOSIS — E119 Type 2 diabetes mellitus without complications: Secondary | ICD-10-CM | POA: Insufficient documentation

## 2021-12-03 DIAGNOSIS — I10 Essential (primary) hypertension: Secondary | ICD-10-CM | POA: Diagnosis not present

## 2021-12-03 DIAGNOSIS — Z01812 Encounter for preprocedural laboratory examination: Secondary | ICD-10-CM | POA: Diagnosis present

## 2021-12-03 DIAGNOSIS — Z01818 Encounter for other preprocedural examination: Secondary | ICD-10-CM

## 2021-12-03 LAB — BASIC METABOLIC PANEL
Anion gap: 8 (ref 5–15)
BUN: 16 mg/dL (ref 8–23)
CO2: 26 mmol/L (ref 22–32)
Calcium: 9.4 mg/dL (ref 8.9–10.3)
Chloride: 105 mmol/L (ref 98–111)
Creatinine, Ser: 1.21 mg/dL (ref 0.61–1.24)
GFR, Estimated: >60 mL/min (ref >60)
Glucose, Bld: 109 mg/dL — ABNORMAL HIGH (ref 70–99)
Potassium: 4.4 mmol/L (ref 3.5–5.1)
Sodium: 139 mmol/L (ref 135–145)

## 2021-12-03 LAB — CBC
HCT: 43.9 % (ref 39.0–52.0)
Hemoglobin: 14.9 g/dL (ref 13.0–17.0)
MCH: 33 pg (ref 26.0–34.0)
MCHC: 33.9 g/dL (ref 30.0–36.0)
MCV: 97.1 fL (ref 80.0–100.0)
Platelets: 156 10*3/uL (ref 150–400)
RBC: 4.52 MIL/uL (ref 4.22–5.81)
RDW: 13.4 % (ref 11.5–15.5)
WBC: 6.5 10*3/uL (ref 4.0–10.5)
nRBC: 0 % (ref 0.0–0.2)

## 2021-12-03 LAB — GLUCOSE, CAPILLARY: Glucose-Capillary: 105 mg/dL — ABNORMAL HIGH (ref 70–99)

## 2021-12-03 LAB — SURGICAL PCR SCREEN
MRSA, PCR: NEGATIVE
Staphylococcus aureus: POSITIVE — AB

## 2021-12-03 LAB — TYPE AND SCREEN
ABO/RH(D): A POS
Antibody Screen: NEGATIVE

## 2021-12-03 LAB — HEMOGLOBIN A1C
Hgb A1c MFr Bld: 5.4 % (ref 4.8–5.6)
Mean Plasma Glucose: 108.28 mg/dL

## 2021-12-03 NOTE — Progress Notes (Signed)
PCP - Pablo Lawrence, NP ?Cardiologist - denies ? ?PPM/ICD - denies ? ? ?Chest x-ray - 05/19/17 ?EKG - 08/29/21 ?Stress Test - 10+ years ago per pt, normal ?ECHO - denies ?Cardiac Cath - denies ? ?Sleep Study - 20 years ago per pt, OSA- ? ?DM- Type 2 ?Fasting Blood Sugar - 100-110 ?Checks Blood Sugar twice a day ? ?Blood Thinner Instructions: n/a ?Aspirin Instructions: pt instructed to contact surgeon's office for instructions ? ?ERAS Protcol - yes, no drink ? ? ?COVID TEST- n/a ? ? ?Anesthesia review: no ? ?Patient denies shortness of breath, fever, cough and chest pain at PAT appointment ? ? ?All instructions explained to the patient, with a verbal understanding of the material. Patient agrees to go over the instructions while at home for a better understanding. Patient also instructed to notify surgeon of any contact with COVID+ person or if he develops any symptoms. The opportunity to ask questions was provided. ?  ?

## 2021-12-07 ENCOUNTER — Encounter (HOSPITAL_COMMUNITY): Payer: Self-pay | Admitting: Emergency Medicine

## 2021-12-07 ENCOUNTER — Other Ambulatory Visit: Payer: Self-pay

## 2021-12-07 ENCOUNTER — Emergency Department (HOSPITAL_COMMUNITY): Payer: HMO

## 2021-12-07 ENCOUNTER — Emergency Department (HOSPITAL_COMMUNITY)
Admission: EM | Admit: 2021-12-07 | Discharge: 2021-12-08 | Disposition: A | Discharge status: Home / Self Care | Payer: HMO | Attending: Emergency Medicine | Admitting: Emergency Medicine

## 2021-12-07 DIAGNOSIS — Z79899 Other long term (current) drug therapy: Secondary | ICD-10-CM | POA: Diagnosis not present

## 2021-12-07 DIAGNOSIS — S0990XA Unspecified injury of head, initial encounter: Secondary | ICD-10-CM

## 2021-12-07 DIAGNOSIS — Y907 Blood alcohol level of 200-239 mg/100 ml: Secondary | ICD-10-CM | POA: Insufficient documentation

## 2021-12-07 DIAGNOSIS — Z7982 Long term (current) use of aspirin: Secondary | ICD-10-CM | POA: Insufficient documentation

## 2021-12-07 DIAGNOSIS — S0101XA Laceration without foreign body of scalp, initial encounter: Secondary | ICD-10-CM | POA: Diagnosis not present

## 2021-12-07 DIAGNOSIS — F10129 Alcohol abuse with intoxication, unspecified: Secondary | ICD-10-CM | POA: Diagnosis not present

## 2021-12-07 DIAGNOSIS — Y92009 Unspecified place in unspecified non-institutional (private) residence as the place of occurrence of the external cause: Secondary | ICD-10-CM | POA: Diagnosis not present

## 2021-12-07 DIAGNOSIS — W19XXXA Unspecified fall, initial encounter: Secondary | ICD-10-CM

## 2021-12-07 DIAGNOSIS — F10920 Alcohol use, unspecified with intoxication, uncomplicated: Secondary | ICD-10-CM

## 2021-12-07 DIAGNOSIS — W01198A Fall on same level from slipping, tripping and stumbling with subsequent striking against other object, initial encounter: Secondary | ICD-10-CM | POA: Diagnosis not present

## 2021-12-07 NOTE — ED Triage Notes (Signed)
Pt brought in by EMS after he fell at home and sustained an LOC for < 1 minute. Bleeding controlled at this time. ?

## 2021-12-07 NOTE — ED Provider Notes (Signed)
? ?[facility]  ?Provider Note ? ?CSN: [id number] ?Arrival date & time: 12/07/21 2109 ? ?History ?Chief Complaint  ?Patient presents with  ? Laceration  ? Fall  ? ? ?Chris Flynn is a 66 y.o. male here with wife who supplements history. H has a remote history of alcohol abuse; reports he 'blacked out' and fell at home earlier today striking his head and sustaining lacerations to his scalp. He has been taking pain killers for chronic back pain, scheduled for surgery next week. He denies any EtOH use tonight. He did not have CP, SOB or palpitations prior. He has otherwise been at baseline.  ? ? ?Home Medications ?Prior to Admission medications   ?Medication Sig Start Date End Date Taking? Authorizing Provider  ?aspirin 325 MG tablet Take 1 tablet (325 mg total) by mouth daily. 09/04/21   Rogene Houston, MD  ?atorvastatin (LIPITOR) 40 MG tablet Take 40 mg by mouth daily.    [provider]  ?carvedilol (COREG) 6.25 MG tablet Take 6.25 mg by mouth 2 (two) times daily. 08/18/19   [provider]  ?Cholecalciferol (DIALYVITE VITAMIN D 5000) 125 MCG (5000 UT) capsule Take 10,000 Units by mouth daily.    [provider]  ?clonazePAM (KLONOPIN) 1 MG tablet Take 1 mg by mouth 2 (two) times daily. 08/16/19   [provider]  ?Cyanocobalamin (B-12) 3000 MCG CAPS Take 3,000 mcg by mouth daily.    [provider]  ?furosemide (LASIX) 40 MG tablet Take 40 mg by mouth daily as needed (fluid retention.).    [provider]  ?gabapentin (NEURONTIN) 600 MG tablet Take 600 mg by mouth 4 (four) times daily.    [provider]  ?HYDROcodone-acetaminophen (NORCO/VICODIN) 5-325 MG tablet Take 1 tablet by mouth every 6 (six) hours as needed for moderate pain. ?Patient taking differently: Take 1 tablet by mouth every 4 (four) hours as needed for moderate pain. 02/03/20   Milton Ferguson, MD  ?Lidocaine HCl-Benzyl Alcohol (SALONPAS LIDOCAINE PLUS EX) Place 1  patch onto the skin daily. Lidocaine 4% Patch    [provider]  ?losartan (COZAAR) 100 MG tablet Take 100 mg by mouth daily. 08/06/19   [provider]  ?metFORMIN (GLUCOPHAGE-XR) 500 MG 24 hr tablet Take 500 mg by mouth 2 (two) times daily.    [provider]  ?methocarbamol (ROBAXIN) 750 MG tablet Take 750 mg by mouth 4 (four) times daily.    [provider]  ?oxymetazoline (AFRIN) 0.05 % nasal spray Place 1 spray into both nostrils 2 (two) times daily as needed for congestion.    [provider]  ?tadalafil (CIALIS) 10 MG tablet Take 10 mg by mouth daily. 10/17/21   [provider]  ?terbinafine (LAMISIL) 250 MG tablet Take 250 mg by mouth daily. 10/30/21   [provider]  ?TRULICITY 4.65 KC/1.2XN SOPN Inject 0.75 mg into the skin every Thursday. 11/26/21   [provider]  ? ? ? ?Allergies    ?Codeine, Serotonin reuptake inhibitors (ssris), and Topamax [topiramate] ? ? ?Review of Systems   ?Review of Systems ?Please see HPI for pertinent positives and negatives ? ?Physical Exam ?BP 115/72   Pulse 76   Temp 98.7 ?F (37.1 ?C) (Oral)   Resp 16   Ht '5\' 7"'$  (1.702 m)   Wt 98.9 kg   SpO2 91%   BMI 34.14 kg/m?  ? ?Physical Exam ?Vitals and nursing note reviewed.  ?Constitutional:   ?  Appearance: Normal appearance.  ?HENT:  ?   Head: Normocephalic.  ?   Comments: There are two 3cm lacerations to scalp, one on R occipital and one on L parietal, bleeding controlled.  ?   Nose: Nose normal.  ?   Mouth/Throat:  ?   Mouth: Mucous membranes are moist.  ?Eyes:  ?   Extraocular Movements: Extraocular movements intact.  ?   Conjunctiva/sclera: Conjunctivae normal.  ?Cardiovascular:  ?   Rate and Rhythm: Normal rate.  ?Pulmonary:  ?   Effort: Pulmonary effort is normal.  ?   Breath sounds: Normal breath sounds.  ?Abdominal:  ?   General: Abdomen is flat.  ?   Palpations: Abdomen is soft.  ?   Tenderness: There is no abdominal tenderness.   ?Musculoskeletal:     ?   General: No swelling. Normal range of motion.  ?   Cervical back: Neck supple.  ?Skin: ?   General: Skin is warm and dry.  ?Neurological:  ?   General: No focal deficit present.  ?   Mental Status: He is alert.  ?Psychiatric:     ?   Mood and Affect: Mood normal.  ? ? ?ED Results / Procedures / Treatments   ?EKG ?EKG Interpretation ? ?Date/Time:  Sunday December 07 2021 23:55:58 EDT ?Ventricular Rate:  82 ?PR Interval:  182 ?QRS Duration: 84 ?QT Interval:  371 ?QTC Calculation: 434 ?R Axis:   47 ?Text Interpretation: Sinus rhythm Low voltage, precordial leads No significant change since last tracing Confirmed by Calvert Cantor [id number]) on 12/08/2021 12:07:04 AM ? ?Procedures ?Marland Kitchen.Laceration Repair ? ?Date/Time: 12/08/2021 1:50 AM ?Performed by: Truddie Hidden, MD ?Authorized by: Truddie Hidden, MD  ? ?Consent:  ?  Consent obtained:  Verbal ?  Consent given by:  Patient ?Anesthesia:  ?  Anesthesia method:  Local infiltration ?  Local anesthetic:  Lidocaine 2% WITH epi ?Laceration details:  ?  Location:  Scalp ?  Scalp location:  Occipital ?  Length (cm):  3 ?Treatment:  ?  Area cleansed with:  Saline ?  Irrigation solution:  Sterile saline ?  Irrigation method:  Syringe ?Skin repair:  ?  Repair method:  Staples ?  Number of staples:  4 ?Approximation:  ?  Approximation:  Close ?Repair type:  ?  Repair type:  Simple ?Post-procedure details:  ?  Dressing:  Open (no dressing) ?Marland Kitchen.Laceration Repair ? ?Date/Time: 12/08/2021 1:51 AM ?Performed by: Truddie Hidden, MD ?Authorized by: Truddie Hidden, MD  ? ?Laceration details:  ?  Location:  Scalp ?  Scalp location:  L parietal ?  Length (cm):  3 ?Treatment:  ?  Area cleansed with:  Saline ?  Irrigation solution:  Sterile water ?  Irrigation method:  Syringe ?Skin repair:  ?  Repair method:  Staples ?  Number of staples:  4 ?Approximation:  ?  Approximation:  Close ?Repair type:  ?  Repair type:  Simple ? ?Medications Ordered in the  ED ?Medications  ?lidocaine-EPINEPHrine (XYLOCAINE W/EPI) 2 %-1:200000 (PF) injection 10 mL (has no administration in time range)  ? ? ?Initial Impression and Plan ? Patient with syncope with fall and head injury. May be due to pain medication use, he denies EtOH tonight. Will check CT and basic labs to rule out acute medical etiology or significant injury. TDAP is UTD.  ? ?ED Course  ? ?Clinical Course as of 12/08/21 0154  ?Mon Dec 08, 2021  ?0055 CBC, BMP are normal. EtOH  is elevated, not consistent with his reported abstinence but likely the cause of his fall.  [CS]  ?0055 I personally viewed the images from radiology studies and agree with radiologist interpretation: No intracranial injury.  ? [CS]  ?36 Reviewed results with patient and wife at bedside. Patient continues to deny EtOH use and cannot account for his elevated EtOH level. Regardless, he is awake alert and safe for discharge with wife. Lacerations repaired, wound care instructions given.  [CS]  ?  ?Clinical Course User Index ?[CS] Truddie Hidden, MD  ? ? ? ?MDM Rules/Calculators/A&P ?Medical Decision Making ?Problems Addressed: ?Alcoholic intoxication without complication ([place]): acute illness or injury ?Fall, initial encounter: acute illness or injury ?Injury of head, initial encounter: acute illness or injury ?Laceration of scalp, initial encounter: acute illness or injury ? ?Amount and/or Complexity of Data Reviewed ?Labs: ordered. Decision-making details documented in ED Course. ?Radiology: ordered and independent interpretation performed. Decision-making details documented in ED Course. ?ECG/medicine tests: ordered and independent interpretation performed. Decision-making details documented in ED Course. ? ?Risk ?Prescription drug management. ? ? ? ?Final Clinical Impression(s) / ED Diagnoses ?Final diagnoses:  ?Fall, initial encounter  ?Injury of head, initial encounter  ?Laceration of scalp, initial encounter  ?Alcoholic intoxication  without complication ([place])  ? ? ?Rx / DC Orders ?ED Discharge Orders   ? ? None  ? ?  ? ?  ?Truddie Hidden, MD ?12/08/21 0154 ? ?

## 2021-12-08 LAB — CBC WITH DIFFERENTIAL/PLATELET
Abs Immature Granulocytes: 0.02 10*3/uL (ref 0.00–0.07)
Basophils Absolute: 0.1 10*3/uL (ref 0.0–0.1)
Basophils Relative: 1 %
Eosinophils Absolute: 0.3 10*3/uL (ref 0.0–0.5)
Eosinophils Relative: 4 %
HCT: 46 % (ref 39.0–52.0)
Hemoglobin: 15.9 g/dL (ref 13.0–17.0)
Immature Granulocytes: 0 %
Lymphocytes Relative: 34 %
Lymphs Abs: 2.2 10*3/uL (ref 0.7–4.0)
MCH: 33.5 pg (ref 26.0–34.0)
MCHC: 34.6 g/dL (ref 30.0–36.0)
MCV: 96.8 fL (ref 80.0–100.0)
Monocytes Absolute: 0.7 10*3/uL (ref 0.1–1.0)
Monocytes Relative: 11 %
Neutro Abs: 3.2 10*3/uL (ref 1.7–7.7)
Neutrophils Relative %: 50 %
Platelets: 183 10*3/uL (ref 150–400)
RBC: 4.75 MIL/uL (ref 4.22–5.81)
RDW: 13.4 % (ref 11.5–15.5)
WBC: 6.5 10*3/uL (ref 4.0–10.5)
nRBC: 0 % (ref 0.0–0.2)

## 2021-12-08 LAB — COMPREHENSIVE METABOLIC PANEL
ALT: 51 U/L — ABNORMAL HIGH (ref 0–44)
AST: 38 U/L (ref 15–41)
Albumin: 4.4 g/dL (ref 3.5–5.0)
Alkaline Phosphatase: 70 U/L (ref 38–126)
Anion gap: 11 (ref 5–15)
BUN: 20 mg/dL (ref 8–23)
CO2: 25 mmol/L (ref 22–32)
Calcium: 9.5 mg/dL (ref 8.9–10.3)
Chloride: 108 mmol/L (ref 98–111)
Creatinine, Ser: 1.02 mg/dL (ref 0.61–1.24)
GFR, Estimated: >60 mL/min (ref >60)
Glucose, Bld: 101 mg/dL — ABNORMAL HIGH (ref 70–99)
Potassium: 4.8 mmol/L (ref 3.5–5.1)
Sodium: 144 mmol/L (ref 135–145)
Total Bilirubin: 0.6 mg/dL (ref 0.3–1.2)
Total Protein: 7.7 g/dL (ref 6.5–8.1)

## 2021-12-08 LAB — ETHANOL: Alcohol, Ethyl (B): 232 mg/dL — ABNORMAL HIGH (ref <10)

## 2021-12-08 MED ORDER — LIDOCAINE-EPINEPHRINE (PF) 2 %-1:200000 IJ SOLN
10.0000 mL | Freq: Once | INTRAMUSCULAR | Status: AC
  Administered 2021-12-08: 10 mL
  Filled 2021-12-08: qty 20

## 2021-12-12 NOTE — Progress Notes (Signed)
Pt's wife notified of time change. Updated arrival time 0800 ?

## 2021-12-14 ENCOUNTER — Ambulatory Visit
Admission: EM | Admit: 2021-12-14 | Discharge: 2021-12-14 | Disposition: A | Discharge status: Home / Self Care | Payer: HMO

## 2021-12-14 ENCOUNTER — Encounter: Payer: Self-pay | Admitting: Emergency Medicine

## 2021-12-14 DIAGNOSIS — Z4802 Encounter for removal of sutures: Secondary | ICD-10-CM | POA: Diagnosis not present

## 2021-12-14 DIAGNOSIS — S0990XD Unspecified injury of head, subsequent encounter: Secondary | ICD-10-CM | POA: Diagnosis not present

## 2021-12-14 NOTE — ED Notes (Signed)
8 staples removed from head ?

## 2021-12-14 NOTE — ED Triage Notes (Signed)
Here for staple removal of 8 staples in head ?

## 2021-12-15 ENCOUNTER — Encounter (HOSPITAL_COMMUNITY)
Admission: RE | Disposition: A | Discharge status: Home / Self Care | Payer: Self-pay | Source: Ambulatory Visit | Attending: Neurological Surgery

## 2021-12-15 ENCOUNTER — Encounter (HOSPITAL_COMMUNITY): Payer: Self-pay | Admitting: Neurological Surgery

## 2021-12-15 ENCOUNTER — Inpatient Hospital Stay (HOSPITAL_COMMUNITY)
Admission: RE | Admit: 2021-12-15 | Discharge: 2021-12-16 | DRG: 455 | Disposition: A | Discharge status: Home / Self Care | Payer: HMO | Source: Ambulatory Visit | Attending: Neurological Surgery | Admitting: Neurological Surgery

## 2021-12-15 ENCOUNTER — Other Ambulatory Visit: Payer: Self-pay

## 2021-12-15 ENCOUNTER — Inpatient Hospital Stay (HOSPITAL_COMMUNITY): Payer: HMO | Admitting: Anesthesiology

## 2021-12-15 ENCOUNTER — Inpatient Hospital Stay (HOSPITAL_COMMUNITY): Payer: HMO

## 2021-12-15 DIAGNOSIS — M7138 Other bursal cyst, other site: Secondary | ICD-10-CM | POA: Diagnosis present

## 2021-12-15 DIAGNOSIS — M4316 Spondylolisthesis, lumbar region: Secondary | ICD-10-CM

## 2021-12-15 DIAGNOSIS — F1011 Alcohol abuse, in remission: Secondary | ICD-10-CM | POA: Diagnosis present

## 2021-12-15 DIAGNOSIS — Z825 Family history of asthma and other chronic lower respiratory diseases: Secondary | ICD-10-CM

## 2021-12-15 DIAGNOSIS — Z885 Allergy status to narcotic agent status: Secondary | ICD-10-CM

## 2021-12-15 DIAGNOSIS — Z888 Allergy status to other drugs, medicaments and biological substances status: Secondary | ICD-10-CM

## 2021-12-15 DIAGNOSIS — M48062 Spinal stenosis, lumbar region with neurogenic claudication: Secondary | ICD-10-CM | POA: Diagnosis present

## 2021-12-15 DIAGNOSIS — I10 Essential (primary) hypertension: Secondary | ICD-10-CM | POA: Diagnosis present

## 2021-12-15 DIAGNOSIS — F4310 Post-traumatic stress disorder, unspecified: Secondary | ICD-10-CM | POA: Diagnosis present

## 2021-12-15 DIAGNOSIS — E1151 Type 2 diabetes mellitus with diabetic peripheral angiopathy without gangrene: Secondary | ICD-10-CM | POA: Diagnosis present

## 2021-12-15 DIAGNOSIS — Z801 Family history of malignant neoplasm of trachea, bronchus and lung: Secondary | ICD-10-CM | POA: Diagnosis not present

## 2021-12-15 DIAGNOSIS — M5126 Other intervertebral disc displacement, lumbar region: Secondary | ICD-10-CM | POA: Diagnosis present

## 2021-12-15 DIAGNOSIS — Z9181 History of falling: Secondary | ICD-10-CM | POA: Diagnosis not present

## 2021-12-15 HISTORY — PX: TRANSFORAMINAL LUMBAR INTERBODY FUSION W/ MIS 1 LEVEL: SHX6145

## 2021-12-15 LAB — GLUCOSE, CAPILLARY
Glucose-Capillary: 103 mg/dL — ABNORMAL HIGH (ref 70–99)
Glucose-Capillary: 154 mg/dL — ABNORMAL HIGH (ref 70–99)
Glucose-Capillary: 214 mg/dL — ABNORMAL HIGH (ref 70–99)

## 2021-12-15 LAB — ABO/RH: ABO/RH(D): A POS

## 2021-12-15 SURGERY — MINIMALLY INVASIVE (MIS) TRANSFORAMINAL LUMBAR INTERBODY FUSION (TLIF) 1 LEVEL
Anesthesia: General

## 2021-12-15 MED ORDER — SODIUM CHLORIDE 0.9% FLUSH: 3.0000 mL | INTRAVENOUS | Status: DC | PRN

## 2021-12-15 MED ORDER — ROCURONIUM BROMIDE 10 MG/ML (PF) SYRINGE
PREFILLED_SYRINGE | INTRAVENOUS | Status: DC | PRN
  Administered 2021-12-15: 20 mg via INTRAVENOUS
  Administered 2021-12-15: 60 mg via INTRAVENOUS
  Administered 2021-12-15: 30 mg via INTRAVENOUS
  Administered 2021-12-15: 40 mg via INTRAVENOUS
  Administered 2021-12-15: 20 mg via INTRAVENOUS

## 2021-12-15 MED ORDER — FENTANYL CITRATE (PF) 250 MCG/5ML IJ SOLN
INTRAMUSCULAR | Status: AC
  Filled 2021-12-15: qty 5

## 2021-12-15 MED ORDER — FENTANYL CITRATE (PF) 250 MCG/5ML IJ SOLN
INTRAMUSCULAR | Status: DC | PRN
  Administered 2021-12-15 (×2): 50 ug via INTRAVENOUS
  Administered 2021-12-15: 75 ug via INTRAVENOUS
  Administered 2021-12-15: 50 ug via INTRAVENOUS
  Administered 2021-12-15: 100 ug via INTRAVENOUS
  Administered 2021-12-15: 75 ug via INTRAVENOUS
  Administered 2021-12-15: 100 ug via INTRAVENOUS

## 2021-12-15 MED ORDER — CHLORHEXIDINE GLUCONATE CLOTH 2 % EX PADS: 6.0000 | MEDICATED_PAD | Freq: Once | CUTANEOUS | Status: DC

## 2021-12-15 MED ORDER — FENTANYL CITRATE (PF) 100 MCG/2ML IJ SOLN
INTRAMUSCULAR | Status: AC
  Filled 2021-12-15: qty 2

## 2021-12-15 MED ORDER — METHOCARBAMOL 750 MG PO TABS
750.0000 mg | ORAL_TABLET | Freq: Four times a day (QID) | ORAL | Status: DC
  Administered 2021-12-15 – 2021-12-16 (×3): 750 mg via ORAL
  Filled 2021-12-15 (×3): qty 1

## 2021-12-15 MED ORDER — ONDANSETRON HCL 4 MG/2ML IJ SOLN
INTRAMUSCULAR | Status: DC | PRN
  Administered 2021-12-15: 4 mg via INTRAVENOUS

## 2021-12-15 MED ORDER — LIDOCAINE 2% (20 MG/ML) 5 ML SYRINGE
INTRAMUSCULAR | Status: AC
  Filled 2021-12-15: qty 10

## 2021-12-15 MED ORDER — ORAL CARE MOUTH RINSE: 15.0000 mL | Freq: Once | OROMUCOSAL | Status: AC

## 2021-12-15 MED ORDER — VITAMIN B-12 1000 MCG PO TABS
3000.0000 ug | ORAL_TABLET | Freq: Every day | ORAL | Status: DC
  Administered 2021-12-15 – 2021-12-16 (×2): 3000 ug via ORAL
  Filled 2021-12-15 (×2): qty 3

## 2021-12-15 MED ORDER — THROMBIN 5000 UNITS EX SOLR
OROMUCOSAL | Status: DC | PRN
  Administered 2021-12-15: 5 mL via TOPICAL

## 2021-12-15 MED ORDER — LIDOCAINE-EPINEPHRINE 1 %-1:100000 IJ SOLN
INTRAMUSCULAR | Status: AC
  Filled 2021-12-15: qty 1

## 2021-12-15 MED ORDER — CARVEDILOL 6.25 MG PO TABS
6.2500 mg | ORAL_TABLET | Freq: Two times a day (BID) | ORAL | Status: DC
  Administered 2021-12-15 – 2021-12-16 (×2): 6.25 mg via ORAL
  Filled 2021-12-15 (×2): qty 1

## 2021-12-15 MED ORDER — OXYCODONE HCL 5 MG PO TABS: 5.0000 mg | ORAL_TABLET | ORAL | Status: DC | PRN

## 2021-12-15 MED ORDER — PHENYLEPHRINE 40 MCG/ML (10ML) SYRINGE FOR IV PUSH (FOR BLOOD PRESSURE SUPPORT)
PREFILLED_SYRINGE | INTRAVENOUS | Status: AC
  Filled 2021-12-15: qty 10

## 2021-12-15 MED ORDER — OXYCODONE HCL 5 MG PO TABS
10.0000 mg | ORAL_TABLET | ORAL | Status: DC | PRN
  Administered 2021-12-15 – 2021-12-16 (×4): 10 mg via ORAL
  Filled 2021-12-15 (×4): qty 2

## 2021-12-15 MED ORDER — MENTHOL 3 MG MT LOZG: 1.0000 | LOZENGE | OROMUCOSAL | Status: DC | PRN

## 2021-12-15 MED ORDER — SODIUM CHLORIDE 0.9 % IV SOLN: 250.0000 mL | INTRAVENOUS | Status: DC

## 2021-12-15 MED ORDER — LOSARTAN POTASSIUM 50 MG PO TABS
100.0000 mg | ORAL_TABLET | Freq: Every day | ORAL | Status: DC
  Administered 2021-12-16: 100 mg via ORAL
  Filled 2021-12-15: qty 2

## 2021-12-15 MED ORDER — ROCURONIUM BROMIDE 10 MG/ML (PF) SYRINGE
PREFILLED_SYRINGE | INTRAVENOUS | Status: AC
  Filled 2021-12-15: qty 10

## 2021-12-15 MED ORDER — OXYCODONE HCL 5 MG/5ML PO SOLN: 5.0000 mg | Freq: Once | ORAL | Status: DC | PRN

## 2021-12-15 MED ORDER — CHLORHEXIDINE GLUCONATE 0.12 % MT SOLN
15.0000 mL | Freq: Once | OROMUCOSAL | Status: AC
  Administered 2021-12-15: 15 mL via OROMUCOSAL
  Filled 2021-12-15: qty 15

## 2021-12-15 MED ORDER — PHENYLEPHRINE 40 MCG/ML (10ML) SYRINGE FOR IV PUSH (FOR BLOOD PRESSURE SUPPORT)
PREFILLED_SYRINGE | INTRAVENOUS | Status: DC | PRN
  Administered 2021-12-15: 40 ug via INTRAVENOUS
  Administered 2021-12-15: 60 ug via INTRAVENOUS

## 2021-12-15 MED ORDER — HYDROMORPHONE HCL 1 MG/ML IJ SOLN: 1.0000 mg | INTRAMUSCULAR | Status: DC | PRN

## 2021-12-15 MED ORDER — LIDOCAINE 2% (20 MG/ML) 5 ML SYRINGE
INTRAMUSCULAR | Status: DC | PRN
  Administered 2021-12-15: 60 mg via INTRAVENOUS

## 2021-12-15 MED ORDER — SODIUM CHLORIDE 0.9% FLUSH
3.0000 mL | Freq: Two times a day (BID) | INTRAVENOUS | Status: DC
  Administered 2021-12-15: 3 mL via INTRAVENOUS

## 2021-12-15 MED ORDER — LIDOCAINE-EPINEPHRINE 1 %-1:100000 IJ SOLN
INTRAMUSCULAR | Status: DC | PRN
  Administered 2021-12-15: 10 mL

## 2021-12-15 MED ORDER — INSULIN ASPART 100 UNIT/ML IJ SOLN
0.0000 [IU] | Freq: Three times a day (TID) | INTRAMUSCULAR | Status: DC
  Administered 2021-12-16: 2 [IU] via SUBCUTANEOUS

## 2021-12-15 MED ORDER — TADALAFIL 5 MG PO TABS: 10.0000 mg | ORAL_TABLET | Freq: Every day | ORAL | Status: DC

## 2021-12-15 MED ORDER — INSULIN ASPART 100 UNIT/ML IJ SOLN: 0.0000 [IU] | INTRAMUSCULAR | Status: DC | PRN

## 2021-12-15 MED ORDER — TERBINAFINE HCL 250 MG PO TABS: 250.0000 mg | ORAL_TABLET | Freq: Every day | ORAL | Status: DC

## 2021-12-15 MED ORDER — LIDOCAINE 5 % EX PTCH: 1.0000 | MEDICATED_PATCH | Freq: Every day | CUTANEOUS | Status: DC

## 2021-12-15 MED ORDER — CLONAZEPAM 0.5 MG PO TABS
1.0000 mg | ORAL_TABLET | Freq: Two times a day (BID) | ORAL | Status: DC
  Administered 2021-12-15 – 2021-12-16 (×2): 1 mg via ORAL
  Filled 2021-12-15 (×2): qty 2

## 2021-12-15 MED ORDER — ONDANSETRON HCL 4 MG/2ML IJ SOLN: 4.0000 mg | Freq: Four times a day (QID) | INTRAMUSCULAR | Status: DC | PRN

## 2021-12-15 MED ORDER — ACETAMINOPHEN 650 MG RE SUPP: 650.0000 mg | RECTAL | Status: DC | PRN

## 2021-12-15 MED ORDER — POLYETHYLENE GLYCOL 3350 17 G PO PACK: 17.0000 g | PACK | Freq: Every day | ORAL | Status: DC | PRN

## 2021-12-15 MED ORDER — DEXAMETHASONE SODIUM PHOSPHATE 10 MG/ML IJ SOLN
INTRAMUSCULAR | Status: DC | PRN
  Administered 2021-12-15: 10 mg via INTRAVENOUS

## 2021-12-15 MED ORDER — ONDANSETRON HCL 4 MG/2ML IJ SOLN
INTRAMUSCULAR | Status: AC
  Filled 2021-12-15: qty 2

## 2021-12-15 MED ORDER — EPHEDRINE 5 MG/ML INJ
INTRAVENOUS | Status: AC
  Filled 2021-12-15: qty 5

## 2021-12-15 MED ORDER — OXYCODONE HCL 5 MG PO TABS: 5.0000 mg | ORAL_TABLET | Freq: Once | ORAL | Status: DC | PRN

## 2021-12-15 MED ORDER — FENTANYL CITRATE (PF) 100 MCG/2ML IJ SOLN
25.0000 ug | INTRAMUSCULAR | Status: DC | PRN
  Administered 2021-12-15 (×2): 50 ug via INTRAVENOUS

## 2021-12-15 MED ORDER — PROPOFOL 10 MG/ML IV BOLUS
INTRAVENOUS | Status: DC | PRN
  Administered 2021-12-15: 150 mg via INTRAVENOUS

## 2021-12-15 MED ORDER — LACTATED RINGERS IV SOLN: INTRAVENOUS | Status: DC

## 2021-12-15 MED ORDER — KETOROLAC TROMETHAMINE 30 MG/ML IJ SOLN
INTRAMUSCULAR | Status: DC | PRN
  Administered 2021-12-15: 30 mg via INTRAVENOUS

## 2021-12-15 MED ORDER — CEFAZOLIN SODIUM-DEXTROSE 2-4 GM/100ML-% IV SOLN
2.0000 g | INTRAVENOUS | Status: AC
  Administered 2021-12-15 (×2): 2 g via INTRAVENOUS
  Filled 2021-12-15: qty 100

## 2021-12-15 MED ORDER — FUROSEMIDE 40 MG PO TABS: 40.0000 mg | ORAL_TABLET | Freq: Every day | ORAL | Status: DC | PRN

## 2021-12-15 MED ORDER — 0.9 % SODIUM CHLORIDE (POUR BTL) OPTIME
TOPICAL | Status: DC | PRN
  Administered 2021-12-15: 1000 mL

## 2021-12-15 MED ORDER — ALBUMIN HUMAN 5 % IV SOLN: INTRAVENOUS | Status: DC | PRN

## 2021-12-15 MED ORDER — GLYCOPYRROLATE PF 0.2 MG/ML IJ SOSY
PREFILLED_SYRINGE | INTRAMUSCULAR | Status: AC
  Filled 2021-12-15: qty 1

## 2021-12-15 MED ORDER — OXYMETAZOLINE HCL 0.05 % NA SOLN
1.0000 | Freq: Two times a day (BID) | NASAL | Status: DC | PRN
  Filled 2021-12-15: qty 30

## 2021-12-15 MED ORDER — INSULIN ASPART 100 UNIT/ML IJ SOLN
0.0000 [IU] | Freq: Every day | INTRAMUSCULAR | Status: DC
  Administered 2021-12-15: 2 [IU] via SUBCUTANEOUS

## 2021-12-15 MED ORDER — VITAMIN D 25 MCG (1000 UNIT) PO TABS
10000.0000 [IU] | ORAL_TABLET | Freq: Every day | ORAL | Status: DC
  Administered 2021-12-15: 10000 [IU] via ORAL
  Filled 2021-12-15 (×2): qty 10

## 2021-12-15 MED ORDER — SUGAMMADEX SODIUM 200 MG/2ML IV SOLN
INTRAVENOUS | Status: DC | PRN
  Administered 2021-12-15: 200 mg via INTRAVENOUS

## 2021-12-15 MED ORDER — GABAPENTIN 600 MG PO TABS
600.0000 mg | ORAL_TABLET | Freq: Four times a day (QID) | ORAL | Status: DC
  Administered 2021-12-15 – 2021-12-16 (×2): 600 mg via ORAL
  Filled 2021-12-15 (×2): qty 1

## 2021-12-15 MED ORDER — PHENOL 1.4 % MT LIQD: 1.0000 | OROMUCOSAL | Status: DC | PRN

## 2021-12-15 MED ORDER — ROCURONIUM BROMIDE 10 MG/ML (PF) SYRINGE
PREFILLED_SYRINGE | INTRAVENOUS | Status: AC
  Filled 2021-12-15: qty 20

## 2021-12-15 MED ORDER — ACETAMINOPHEN 325 MG PO TABS: 650.0000 mg | ORAL_TABLET | ORAL | Status: DC | PRN

## 2021-12-15 MED ORDER — DEXAMETHASONE SODIUM PHOSPHATE 10 MG/ML IJ SOLN
INTRAMUSCULAR | Status: AC
  Filled 2021-12-15: qty 1

## 2021-12-15 MED ORDER — CEFAZOLIN SODIUM-DEXTROSE 2-4 GM/100ML-% IV SOLN
2.0000 g | Freq: Three times a day (TID) | INTRAVENOUS | Status: AC
  Administered 2021-12-15 – 2021-12-16 (×2): 2 g via INTRAVENOUS
  Filled 2021-12-15 (×2): qty 100

## 2021-12-15 MED ORDER — DOCUSATE SODIUM 100 MG PO CAPS
100.0000 mg | ORAL_CAPSULE | Freq: Two times a day (BID) | ORAL | Status: DC
  Administered 2021-12-15 – 2021-12-16 (×2): 100 mg via ORAL
  Filled 2021-12-15 (×2): qty 1

## 2021-12-15 MED ORDER — CEFAZOLIN SODIUM-DEXTROSE 2-4 GM/100ML-% IV SOLN
INTRAVENOUS | Status: AC
  Filled 2021-12-15: qty 100

## 2021-12-15 MED ORDER — METFORMIN HCL ER 500 MG PO TB24
500.0000 mg | ORAL_TABLET | Freq: Two times a day (BID) | ORAL | Status: DC
  Administered 2021-12-16: 500 mg via ORAL
  Filled 2021-12-15: qty 1

## 2021-12-15 MED ORDER — DULAGLUTIDE 0.75 MG/0.5ML ~~LOC~~ SOAJ: 0.7500 mg | SUBCUTANEOUS | Status: DC

## 2021-12-15 MED ORDER — ATORVASTATIN CALCIUM 40 MG PO TABS
40.0000 mg | ORAL_TABLET | Freq: Every day | ORAL | Status: DC
  Administered 2021-12-15 – 2021-12-16 (×2): 40 mg via ORAL
  Filled 2021-12-15 (×2): qty 1

## 2021-12-15 MED ORDER — ONDANSETRON HCL 4 MG PO TABS: 4.0000 mg | ORAL_TABLET | Freq: Four times a day (QID) | ORAL | Status: DC | PRN

## 2021-12-15 MED ORDER — THROMBIN 5000 UNITS EX SOLR
CUTANEOUS | Status: AC
  Filled 2021-12-15: qty 5000

## 2021-12-15 SURGICAL SUPPLY — 65 items
BAG COUNTER SPONGE SURGICOUNT (BAG) ×3 IMPLANT
BAND RUBBER #18 3X1/16 STRL (MISCELLANEOUS) ×4 IMPLANT
BASKET BONE COLLECTION (BASKET) ×2 IMPLANT
BLADE CLIPPER SURG (BLADE) IMPLANT
BLADE SURG 11 STRL SS (BLADE) ×2 IMPLANT
BUR MATCHSTICK NEURO 3.0 LAGG (BURR) IMPLANT
BUR ROUND PRECISION 4.0 (BURR) ×2 IMPLANT
CAGE EXP CATALYFT 9 (Plate) ×1 IMPLANT
CNTNR URN SCR LID CUP LEK RST (MISCELLANEOUS) ×1 IMPLANT
CONT SPEC 4OZ STRL OR WHT (MISCELLANEOUS) ×2
COVER BACK TABLE 60X90IN (DRAPES) ×2 IMPLANT
DECANTER SPIKE VIAL GLASS SM (MISCELLANEOUS) ×2 IMPLANT
DERMABOND ADHESIVE PROPEN (GAUZE/BANDAGES/DRESSINGS) ×1
DERMABOND ADVANCED (GAUZE/BANDAGES/DRESSINGS) ×1
DERMABOND ADVANCED .7 DNX12 (GAUZE/BANDAGES/DRESSINGS) ×1 IMPLANT
DERMABOND ADVANCED .7 DNX6 (GAUZE/BANDAGES/DRESSINGS) IMPLANT
DRAPE C-ARM 42X72 X-RAY (DRAPES) ×2 IMPLANT
DRAPE C-ARMOR (DRAPES) ×2 IMPLANT
DRAPE HALF SHEET 40X57 (DRAPES) ×1 IMPLANT
DRAPE INCISE IOBAN 66X45 STRL (DRAPES) ×1 IMPLANT
DRAPE LAPAROTOMY 100X72X124 (DRAPES) ×2 IMPLANT
DRAPE MICROSCOPE LEICA (MISCELLANEOUS) ×2 IMPLANT
DRAPE SURG 17X23 STRL (DRAPES) ×4 IMPLANT
ELECT BLADE 6.5 EXT (BLADE) ×2 IMPLANT
ELECT REM PT RETURN 9FT ADLT (ELECTROSURGICAL) ×2
ELECTRODE REM PT RTRN 9FT ADLT (ELECTROSURGICAL) ×1 IMPLANT
EXTENDER TAB GUIDE SV 5.5/6.0 (INSTRUMENTS) ×8 IMPLANT
GAUZE 4X4 16PLY ~~LOC~~+RFID DBL (SPONGE) IMPLANT
GAUZE SPONGE 4X4 12PLY STRL (GAUZE/BANDAGES/DRESSINGS) ×2 IMPLANT
GLOVE BIOGEL PI IND STRL 7.5 (GLOVE) ×1 IMPLANT
GLOVE BIOGEL PI INDICATOR 7.5 (GLOVE) ×1
GLOVE ECLIPSE 7.5 STRL STRAW (GLOVE) ×2 IMPLANT
GOWN STRL REUS W/ TWL LRG LVL3 (GOWN DISPOSABLE) ×1 IMPLANT
GOWN STRL REUS W/ TWL XL LVL3 (GOWN DISPOSABLE) IMPLANT
GOWN STRL REUS W/TWL 2XL LVL3 (GOWN DISPOSABLE) IMPLANT
GOWN STRL REUS W/TWL LRG LVL3 (GOWN DISPOSABLE) ×2
GOWN STRL REUS W/TWL XL LVL3 (GOWN DISPOSABLE)
GUIDEWIRE BLUNT NT 450 (WIRE) ×4 IMPLANT
HEMOSTAT POWDER KIT SURGIFOAM (HEMOSTASIS) ×2 IMPLANT
KIT BASIN OR (CUSTOM PROCEDURE TRAY) ×2 IMPLANT
KIT POSITION SURG JACKSON T1 (MISCELLANEOUS) ×2 IMPLANT
KIT TURNOVER KIT B (KITS) ×1 IMPLANT
NDL BEVEL TWO-PAK W/1PK (NEEDLE) IMPLANT
NDL HYPO 18GX1.5 BLUNT FILL (NEEDLE) IMPLANT
NDL SPNL 18GX3.5 QUINCKE PK (NEEDLE) IMPLANT
NEEDLE BEVEL TWO-PAK W/1PK (NEEDLE) ×4 IMPLANT
NEEDLE HYPO 18GX1.5 BLUNT FILL (NEEDLE) IMPLANT
NEEDLE HYPO 22GX1.5 SAFETY (NEEDLE) ×2 IMPLANT
NEEDLE SPNL 18GX3.5 QUINCKE PK (NEEDLE) IMPLANT
NS IRRIG 1000ML POUR BTL (IV SOLUTION) ×2 IMPLANT
PACK LAMINECTOMY NEURO (CUSTOM PROCEDURE TRAY) ×2 IMPLANT
PAD ARMBOARD 7.5X6 YLW CONV (MISCELLANEOUS) ×4 IMPLANT
ROD 5.5 CCM PERC 40 (Rod) ×2 IMPLANT
SCREW MAS VOYAGER 6.5X40 (Screw) ×4 IMPLANT
SCREW SET 5.5/6.0MM SOLERA (Screw) ×4 IMPLANT
SPONGE T-LAP 4X18 ~~LOC~~+RFID (SPONGE) IMPLANT
SUT MNCRL AB 3-0 PS2 18 (SUTURE) ×2 IMPLANT
SUT VIC AB 0 CT1 18XCR BRD8 (SUTURE) IMPLANT
SUT VIC AB 0 CT1 8-18 (SUTURE)
SUT VIC AB 2-0 CP2 18 (SUTURE) ×4 IMPLANT
SYR 3ML LL SCALE MARK (SYRINGE) IMPLANT
TOWEL GREEN STERILE (TOWEL DISPOSABLE) ×2 IMPLANT
TOWEL GREEN STERILE FF (TOWEL DISPOSABLE) ×2 IMPLANT
TRAY FOLEY MTR SLVR 16FR STAT (SET/KITS/TRAYS/PACK) ×1 IMPLANT
WATER STERILE IRR 1000ML POUR (IV SOLUTION) ×2 IMPLANT

## 2021-12-15 NOTE — Transfer of Care (Signed)
Immediate Anesthesia Transfer of Care Note ? ?Patient: Chris Flynn ? ?Procedure(s) Performed: Lumbar Four-Five Minimally Invasive Laminectomy, Transforaminal Lumbar Interbody Fusion with Lumbar Two-Three Minimally Invasive Laminectomy for resection of synovial cyst ? ?Patient Location: PACU ? ?Anesthesia Type:General ? ?Level of Consciousness: awake, alert  and oriented ? ?Airway & Oxygen Therapy: Patient Spontanous Breathing and Patient connected to face mask oxygen ? ?Post-op Assessment: Report given to RN, Post -op Vital signs reviewed and stable and Patient moving all extremities X 4 ? ?Post vital signs: Reviewed and stable ? ?Last Vitals:  ?Vitals Value Taken Time  ?BP 175/83 12/15/21 1504  ?Temp    ?Pulse 110 12/15/21 1505  ?Resp 15 12/15/21 1505  ?SpO2 100 % 12/15/21 1505  ?Vitals shown include unvalidated device data. ? ?Last Pain:  ?Vitals:  ? 12/15/21 0838  ?TempSrc:   ?PainSc: 8   ?   ? ?Patients Stated Pain Goal: 4 (12/15/21 9458) ? ?Complications: No notable events documented. ?

## 2021-12-15 NOTE — Op Note (Signed)
PATIENT: Chris Flynn ? ?DAY OF SURGERY: 12/15/21 ?  ?PRE-OPERATIVE DIAGNOSIS:  Lumbar stenosis with neurogenic claudication, lumbar spondylolisthesis, lumbar synovial cyst ?  ?POST-OPERATIVE DIAGNOSIS:  Same ?  ?PROCEDURE:  L4-L5 minimally invasive laminectomy and transforaminal lumbar interbody fusion, L2-3 minimally invasive laminectomy and resection of synovial cyst ?  ?SURGEON:  Surgeon(s) and Role: ?   Judith Part, MD - Primary ?  ?ANESTHESIA: ETGA ?  ?BRIEF HISTORY: This is a 66 year old man who presented with low back and bilateral lower extremity pain. I previously treated him for an L1-2 lumbar disc herniation that had a repeat herniation requiring a second discectomy, but ultimately did quite well. Workup showed progression of canal stenosis at L2-3 with suspected right sided synovial cyst and progression of an L4-5 lumbar spondylolisthesis with canal and foraminal stenosis. His symptoms were not responsive to non-surgical treatment measures, I therefore recommended surgery in the form of decompression and resection of the synovial cyst at L2-3 and decompression with TLIF at L4-5.  This was discussed with the patient as well as risks, benefits, and alternatives and wished to proceed with surgery. ?  ?OPERATIVE DETAIL:  The patient was taken to the operating room and placed on the OR table in the prone position. A formal time out was performed with two patient identifiers and confirmed the operative site. Anesthesia was induced by the anesthesia team. The operative site was marked, hair was clipped with surgical clippers, the area was then prepped and draped in a sterile fashion.  ? ?Fluoroscopy was used to localize the surgical level. The pedicles were marked and used to create skin incisions bilaterally. With fluoro guidance, Jamshidi needles were used to guide K-wires into the bilateral L4 and L5 pedicles. The K wires were then secured with hemostats and attention turned to the TLIF. ? ?A  MetRx tube was then docked to the right L4-5 facet through the same incision using fluoroscopy. A right L4-5 complete facetectomy was performed and the right L4 nerve root was decompressed along its entire course. The tube was wanded medially and the decompression was continued medially until reaching the contralateral foramen to complete a laminectomy which was obviously more than what was needed for instrumentation alone and was to treat the severe canal stenosis.  ? ?The tube was wanded back to the disc space. The disc space was identified, incised, and a discectomy was performed in the standard fashion. The endplates were prepped, bone graft was packed into the disc space, and an expandable cage ([employer]) was packed with autograft and placed into the disc space with fluoroscopic confirmation. The tube was removed and hemostasis was obtained during its removal.  ? ?Using the previously placed K wires, a tap and then screw with tower were placed bilaterally at L4 and L5. A rod was sized and introduced on both sides, confirmed with fluoroscopy, then final tightened. Hemostasis was again confirmed for both incisions, they were copiously irrigated, and then closed in layers.  ? ?Attention was then turned to L2-3. Fluoroscopy was used to localize the level. I was able to use the existing right sided incision to approach L2-3. Using fluoroscopy, a MetRx tube was docked in a similar fashion as above with placement of a 65m tube. Soft tissues were dissected, landmarks confirmed, fluoroscopy used to re-confirm the correct level. A decompressive laminectomy was performed in an MIS fashion, again with the same technique as described above with high speed drill and kerrison rongeurs to decompress the ipsilateral lateral  recess, central canal, and contralateral lateral recess. There was a synovial cyst present that was dissected circumferentially. It was quite adherent and, while dissecting the cyst laterally, there was a  durotomy with arachnoid bulging out of the defect but no CSF leak. This was gently reduced and protected without leak during the case. With the canal well decompressed, hemostasis was obtained and confirmed, the wound was copiously irrigated, counts were correct, the tube was removed under microscopic visualization for hemostasis, and the incision was closed in layers. The patient was then returned to anesthesia for emergence.  ? ?EBL:  255m ?  ?DRAINS: none ?  ?SPECIMENS: none ?  ?TJudith Part MD ?12/15/21 ?9:27 AM ? ?

## 2021-12-15 NOTE — Anesthesia Procedure Notes (Signed)
Procedure Name: Intubation ?Date/Time: 12/15/2021 10:21 AM ?Performed by: Mariea Clonts, CRNA ?Pre-anesthesia Checklist: Patient identified, Emergency Drugs available, Suction available and Patient being monitored ?Patient Re-evaluated:Patient Re-evaluated prior to induction ?Oxygen Delivery Method: Circle System Utilized ?Preoxygenation: Pre-oxygenation with 100% oxygen ?Induction Type: IV induction and Cricoid Pressure applied ?Ventilation: Mask ventilation without difficulty ?Laryngoscope Size: Sabra Heck and 2 ?Grade View: Grade II ?Tube type: Oral ?Tube size: 7.5 mm ?Number of attempts: 1 ?Airway Equipment and Method: Stylet and Oral airway ?Placement Confirmation: ETT inserted through vocal cords under direct vision, positive ETCO2 and breath sounds checked- equal and bilateral ?Tube secured with: Tape ?Dental Injury: Teeth and Oropharynx as per pre-operative assessment  ? ? ? ? ?

## 2021-12-15 NOTE — H&P (Signed)
Surgical H&P Update ? ?HPI: 66 y.o. with a history of low back pain and claudication due to severe canal stenosis and spondylolisthesis. No changes in health since they were last seen except a fall, stroking his head with scalp lac requiring staples, which were removed without issue. CTH was negative, no neurologic symptoms since the fall. Still having severe back / leg pain and wishes to proceed with surgery. ? ?PMHx:  ?Past Medical History:  ?Diagnosis Date  ? Diabetes ([place])   ? type 2  ? Dizziness   ? Headache   ? History of alcohol abuse   ? sober since 2017  ? Hypertension   ? Positive colorectal cancer screening using Cologuard test   ? Post concussive syndrome   ? PTSD (post-traumatic stress disorder)   ? Short-term memory loss   ? ?FamHx:  ?Family History  ?Problem Relation Age of Onset  ? COPD Mother   ? Lung cancer Father   ? ?SocHx:  reports that he has never smoked. He has never used smokeless tobacco. He reports that he does not drink alcohol and does not use drugs. ? ?Physical Exam: ?Aox3, PERRL, EOMI, FS & SS, Strength 5/5 x4 and SILTx4  ? ?Assesment/Plan: ?66 y.o. man with h/o prior L1-2 MIS discectomy, recurrence and repeat discectomy, now with severe low back pain with claudication 2/2 lumbar spondylolisthesis, canal stenosis at L4-5 and L2-3 severe canal stenosis with likely synovial cyst, here for L4-5 MIS laminectomy / TLIF and L2-3 MIS decompression and resection of synovial cyst. Risks, benefits, and alternatives discussed and the patient would like to continue with surgery. ? ?-OR today ?-3C post-op ? ?Judith Part, MD ?12/15/21 ?9:23 AM ? ?

## 2021-12-15 NOTE — Anesthesia Preprocedure Evaluation (Signed)
Anesthesia Evaluation  ?Patient identified by MRN, date of birth, ID band ?Patient awake ? ? ? ?Reviewed: ?Allergy & Precautions, H&P , NPO status , Patient's Chart, lab work & pertinent test results ? ?Airway ?Mallampati: II ? ? ?Neck ROM: full ? ? ? Dental ?  ?Pulmonary ?neg pulmonary ROS,  ?  ?breath sounds clear to auscultation ? ? ? ? ? ? Cardiovascular ?hypertension,  ?Rhythm:regular Rate:Normal ? ? ?  ?Neuro/Psych ? Headaches, PSYCHIATRIC DISORDERS Anxiety  Neuromuscular disease   ? GI/Hepatic ?  ?Endo/Other  ?diabetes, Type 2 ? Renal/GU ?  ? ?  ?Musculoskeletal ? ? Abdominal ?  ?Peds ? Hematology ?  ?Anesthesia Other Findings ? ? Reproductive/Obstetrics ? ?  ? ? ? ? ? ? ? ? ? ? ? ? ? ?  ?  ? ? ? ? ? ? ? ? ?Anesthesia Physical ?Anesthesia Plan ? ?ASA: 2 ? ?Anesthesia Plan: General  ? ?Post-op Pain Management:   ? ?Induction: Intravenous ? ?PONV Risk Score and Plan: 2 and Ondansetron, Dexamethasone, Midazolam and Treatment may vary due to age or medical condition ? ?Airway Management Planned: Oral ETT ? ?Additional Equipment:  ? ?Intra-op Plan:  ? ?Post-operative Plan: Extubation in OR ? ?Informed Consent: I have reviewed the patients History and Physical, chart, labs and discussed the procedure including the risks, benefits and alternatives for the proposed anesthesia with the patient or authorized representative who has indicated his/her understanding and acceptance.  ? ? ? ?Dental advisory given ? ?Plan Discussed with: CRNA, Anesthesiologist and Surgeon ? ?Anesthesia Plan Comments:   ? ? ? ? ? ? ?Anesthesia Quick Evaluation ? ?

## 2021-12-16 ENCOUNTER — Encounter (HOSPITAL_COMMUNITY): Payer: Self-pay | Admitting: Neurological Surgery

## 2021-12-16 LAB — GLUCOSE, CAPILLARY: Glucose-Capillary: 138 mg/dL — ABNORMAL HIGH (ref 70–99)

## 2021-12-16 MED ORDER — OXYCODONE HCL 5 MG PO TABS: 5.0000 mg | ORAL_TABLET | ORAL | 0 refills | Status: DC | PRN

## 2021-12-16 MED ORDER — ASPIRIN 325 MG PO TABS: 325.0000 mg | ORAL_TABLET | Freq: Every day | ORAL | Status: DC

## 2021-12-16 NOTE — Evaluation (Signed)
Occupational Therapy Evaluation ?Patient Details ?Name: Chris Flynn ?MRN: [id number] ?DOB: 1955/11/19 ?Today's Date: 12/16/2021 ? ? ?History of Present Illness 66 yo male s/p 4/17 L 4-5 laminectomy and TLIF L2-3 laminectomy and resection of synovial cyst PMH fall 4/9 staples to scalp,  remote hx of ETOH abuse, DM2 HTN Colorectal CA, post concussive syndrome, PTSD, STM,  ? ?Clinical Impression ?  ?Patient is s/p L 4-5 lamiectomy and TLIF L2-3 surgery resulting in functional limitations due to the deficits listed below (see OT problem list). Pt currently with balance deficits noted and holding environmental supports. OT communicating with PT Mickel Baas to express further need for balance with DME during transfers. Pt able to dress with AE and has AE at home.  Pt plans to stay on main level of the house sleeping on couch and using 1/2 bath with sponge bath at home. Patient will benefit from skilled OT acutely to increase independence and safety with ADLS to allow discharge [place]. ?  ?   ? ?Recommendations for follow up therapy are one component of a multi-disciplinary discharge planning process, led by the attending physician.  Recommendations may be updated based on patient status, additional functional criteria and insurance authorization.  ? ?Follow Up Recommendations ? Home health OT  ?  ?Assistance Recommended at Discharge Set up Supervision/Assistance  ?Patient can return home with the following A little help with walking and/or transfers;A little help with bathing/dressing/bathroom;Assistance with cooking/housework;Direct supervision/assist for medications management;Direct supervision/assist for financial management;Assist for transportation ? ?  ?Functional Status Assessment ? Patient has had a recent decline in their functional status and demonstrates the ability to make significant improvements in function in a reasonable and predictable amount of time.  ?Equipment Recommendations ? None recommended by OT  ?   ?Recommendations for Other Services   ? ? ?  ?Precautions / Restrictions Precautions ?Precautions: Fall;Back ?Precaution Comments: handout provided and reviewed  ? ?  ? ?Mobility Bed Mobility ?Overal bed mobility: Modified Independent ?  ?  ?  ?  ?  ?  ?General bed mobility comments: min cues for UE placement to avoid twisting ?  ? ?Transfers ?Overall transfer level: Modified independent ?  ?  ?  ?  ?  ?  ?  ?  ?General transfer comment: using bil Ue to push from bed surface ?  ? ?  ?Balance Overall balance assessment: Mild deficits observed, not formally tested, Needs assistance ?Sitting-balance support: Bilateral upper extremity supported, Feet supported ?Sitting balance-Leahy Scale: Good ?  ?  ?Standing balance support: Bilateral upper extremity supported, During functional activity ?Standing balance-Leahy Scale: Poor ?  ?  ?  ?  ?  ?  ?  ?  ?  ?  ?  ?  ?   ? ?ADL either performed or assessed with clinical judgement  ? ?ADL Overall ADL's : Needs assistance/impaired ?Eating/Feeding: Independent ?  ?Grooming: Modified independent ?  ?Upper Body Bathing: Modified independent ?  ?Lower Body Bathing: Modified independent;Sit to/from stand;With adaptive equipment ?  ?Upper Body Dressing : Modified independent ?  ?Lower Body Dressing: Modified independent;Sit to/from stand;With adaptive equipment ?Lower Body Dressing Details (indicate cue type and reason): using reacher and long handle shoe horn ?Toilet Transfer: Minimal assistance ?Toilet Transfer Details (indicate cue type and reason): reaching for environmental support ?  ?  ?  ?  ?Functional mobility during ADLs: Minimal assistance ?General ADL Comments: pt walking holding the wall at this time. Recommendation for further assessment with DME. OT spoke with  PT regarding using environmental support due to balance deficits  ? ? ? ?Vision Baseline Vision/History: 1 Wears glasses ?Additional Comments: reading glasses  ?   ?Perception   ?  ?Praxis   ?  ? ?Pertinent  Vitals/Pain Pain Assessment ?Pain Assessment: Faces ?Faces Pain Scale: Hurts little more ?Pain Location: R LE at buttock area ?Pain Descriptors / Indicators: Discomfort, Grimacing, Guarding ?Pain Intervention(s): Monitored during session, Repositioned  ? ? ? ?Hand Dominance Right ?  ?Extremity/Trunk Assessment Upper Extremity Assessment ?Upper Extremity Assessment: Overall WFL for tasks assessed ?  ?Lower Extremity Assessment ?Lower Extremity Assessment: Defer to PT evaluation ?  ?Cervical / Trunk Assessment ?Cervical / Trunk Assessment: Back Surgery ?  ?Communication Communication ?Communication: No difficulties ?  ?Cognition Arousal/Alertness: Awake/alert ?Behavior During Therapy: [facility] for tasks assessed/performed ?Overall Cognitive Status: Within Functional Limits for tasks assessed ?  ?  ?  ?  ?  ?  ?  ?  ?  ?  ?  ?  ?  ?  ?  ?  ?General Comments: chart reports STM demonstrates [facility] for task assessed this session. pt recalling information and able to verbalize back precautions from memory prior to handout ?  ?  ?General Comments  dressing intact and dry at this time. ? ?  ?Exercises   ?  ?Shoulder Instructions    ? ? ?Home Living Family/patient expects to be discharged to:: Private residence ?Living Arrangements: Spouse/significant other ?Available Help at Discharge: Family;Available 24 hours/day;Other (Comment) (has keeper 2x per week) ?Type of Home: House ?Home Access: Level entry ?  ?  ?Home Layout: Two level;1/2 bath on main level;Bed/bath upstairs ?Alternate Level Stairs-Number of Steps: 16 ?  ?Bathroom Shower/Tub: Walk-in shower ?  ?Bathroom Toilet: Standard (handicapped on 2nd floor) ?  ?  ?Home Equipment: Conservation officer, nature (2 wheels);Shower seat;Adaptive equipment ?Adaptive Equipment: Long-handled shoe horn;Reacher;Other (Comment) (long hook like in department store to lift clothes off the rack) ?Additional Comments: 4 cats and 1 dog. Pt reports 3 cats are more his than wife's. ?  ? ?  ?Prior  Functioning/Environment Prior Level of Function : Independent/Modified Independent;Driving ?  ?  ?  ?  ?  ?  ?  ?ADLs Comments: using AE to dress baseline ?  ? ?  ?  ?OT Problem List: Impaired balance (sitting and/or standing);Decreased knowledge of use of DME or AE ?  ?   ?OT Treatment/Interventions: Self-care/ADL training;Therapeutic exercise;Energy conservation;DME and/or AE instruction;Manual therapy;Modalities;Therapeutic activities;Patient/family education;Balance training  ?  ?OT Goals(Current goals can be found in the care plan section) Acute Rehab OT Goals ?Patient Stated Goal: to return home ?OT Goal Formulation: With patient ?Time For Goal Achievement: 12/30/21 ?Potential to Achieve Goals: Good  ?OT Frequency: Min 2X/week ?  ? ?Co-evaluation   ?  ?  ?  ?  ? ?  ?AM-PAC OT "6 Clicks" Daily Activity     ?Outcome Measure Help from another person eating meals?: None ?Help from another person taking care of personal grooming?: None ?Help from another person toileting, which includes using toliet, bedpan, or urinal?: A Little ?Help from another person bathing (including washing, rinsing, drying)?: A Little ?Help from another person to put on and taking off regular upper body clothing?: None ?Help from another person to put on and taking off regular lower body clothing?: A Little ?6 Click Score: 21 ?  ?End of Session Nurse Communication: Mobility status;Precautions ? ?Activity Tolerance: Patient tolerated treatment well ?Patient left: in chair;with call bell/phone within reach (RN arriving  with medication) ? ?OT Visit Diagnosis: Unsteadiness on feet (R26.81)  ?              ?Time: 7425-9563 ?OT Time Calculation (min): 25 min ?Charges:  OT General Charges ?$OT Visit: 1 Visit ?OT Evaluation ?$OT Eval Moderate Complexity: 1 Mod ? ? ?Brynn, OTR/L  ?Acute Rehabilitation Services ?Pager: [phone] ?Office: [phone] ?. ? ? ?Jeri Modena ?12/16/2021, 8:49 AM ?

## 2021-12-16 NOTE — Evaluation (Signed)
Physical Therapy Evaluation and Discharge ?Patient Details ?Name: Chris Flynn ?MRN: [id number] ?DOB: 1955-12-31 ?Today's Date: 12/16/2021 ? ?History of Present Illness ? Pt is a 66 y/o male s/p L4-5 laminectomy and TLIF, and L2-3 laminectomy and resection of synovial cyst on 12/15/2021. PMH significant for fall 4/9 with staples to scalp, remote hx of ETOH abuse, DM2, HTN, Colorectal CA, post concussive syndrome, PTSD, STM deficits. ?  ?Clinical Impression ? Patient evaluated by Physical Therapy with no further acute PT needs identified. All education has been completed and the patient has no further questions. Pt was able to demonstrate transfers and ambulation with gross modified independence to min guard assist for safety and RW for support. Pt was educated on precautions, brace application/wearing schedule, appropriate activity progression, and car transfer. He will have adequate assist available at d/c. See below for any follow-up Physical Therapy or equipment needs. PT is signing off. Thank you for this referral.    ?   ? ?Recommendations for follow up therapy are one component of a multi-disciplinary discharge planning process, led by the attending physician.  Recommendations may be updated based on patient status, additional functional criteria and insurance authorization. ? ?Follow Up Recommendations Outpatient PT (When appropriate per post-op protocol. MD to order.) ? ?  ?Assistance Recommended at Discharge Intermittent Supervision/Assistance  ?Patient can return home with the following ? A little help with walking and/or transfers;Assistance with cooking/housework;Assist for transportation;Help with stairs or ramp for entrance ? ?  ?Equipment Recommendations None recommended by PT  ?Recommendations for Other Services ?    ?  ?Functional Status Assessment Patient has had a recent decline in their functional status and demonstrates the ability to make significant improvements in function in a reasonable  and predictable amount of time.  ? ?  ?Precautions / Restrictions Precautions ?Precautions: Fall;Back ?Precaution Booklet Issued: Yes (comment) ?Precaution Comments: handout provided and reviewed ?Restrictions ?Weight Bearing Restrictions: No  ? ?  ? ?Mobility ? Bed Mobility ?  ?  ?  ?  ?  ?  ?  ?General bed mobility comments: Pt was received sitting up in recliner. ?  ? ?Transfers ?Overall transfer level: Modified independent ?Equipment used: Rolling walker (2 wheels) ?  ?  ?  ?  ?  ?  ?  ?General transfer comment: Pt demonstrating good hand placement on seated surface for safety. ?  ? ?Ambulation/Gait ?Ambulation/Gait assistance: Min guard, Supervision ?Gait Distance (Feet): 300 Feet ?Assistive device: Rolling walker (2 wheels) ?Gait Pattern/deviations: Step-through pattern, Decreased stride length, Trunk flexed ?Gait velocity: Decreased ?Gait velocity interpretation: 1.31 - 2.62 ft/sec, indicative of limited community ambulator ?  ?General Gait Details: VC's for improved posture, closer walker proximity, and forward gaze. No assist required. Occasional unsteadiness noted. ? ?Stairs ?Stairs: Yes ?Stairs assistance: Min guard ?Stair Management: One rail Right, Step to pattern, Forwards ?Number of Stairs: 10 ?General stair comments: VC's for sequencing and general safety. No assist required however hands on guarding provided for safety throughout. ? ?Wheelchair Mobility ?  ? ?Modified Rankin (Stroke Patients Only) ?  ? ?  ? ?Balance Overall balance assessment: Mild deficits observed, not formally tested, Needs assistance ?Sitting-balance support: Bilateral upper extremity supported, Feet supported ?Sitting balance-Leahy Scale: Good ?  ?  ?Standing balance support: Bilateral upper extremity supported, During functional activity ?Standing balance-Leahy Scale: Poor ?  ?  ?  ?  ?  ?  ?  ?  ?  ?  ?  ?  ?   ? ? ? ?  Pertinent Vitals/Pain Pain Assessment ?Pain Assessment: Faces ?Faces Pain Scale: Hurts little more ?Pain  Location: R LE at buttock area ?Pain Descriptors / Indicators: Discomfort, Grimacing, Guarding ?Pain Intervention(s): Limited activity within patient's tolerance, Monitored during session, Repositioned  ? ? ?Home Living Family/patient expects to be discharged to:: Private residence ?Living Arrangements: Spouse/significant other ?Available Help at Discharge: Family;Available 24 hours/day;Other (Comment) (has housekeeper 2x per week) ?Type of Home: House ?Home Access: Level entry ?  ?  ?Alternate Level Stairs-Number of Steps: 16 ?Home Layout: Two level;1/2 bath on main level;Bed/bath upstairs ?Home Equipment: Conservation officer, nature (2 wheels);Shower seat;Adaptive equipment ?Additional Comments: 4 cats and 1 dog. Pt reports 3 cats are more his than wife's.  ?  ?Prior Function Prior Level of Function : Independent/Modified Independent;Driving ?  ?  ?  ?  ?  ?  ?  ?ADLs Comments: using AE to dress baseline ?  ? ? ?Hand Dominance  ? Dominant Hand: Right ? ?  ?Extremity/Trunk Assessment  ? Upper Extremity Assessment ?Upper Extremity Assessment: Overall WFL for tasks assessed ?  ? ?Lower Extremity Assessment ?Lower Extremity Assessment: Generalized weakness ?  ? ?Cervical / Trunk Assessment ?Cervical / Trunk Assessment: Back Surgery  ?Communication  ? Communication: No difficulties  ?Cognition Arousal/Alertness: Awake/alert ?Behavior During Therapy: [facility] for tasks assessed/performed ?Overall Cognitive Status: Within Functional Limits for tasks assessed ?  ?  ?  ?  ?  ?  ?  ?  ?  ?  ?  ?  ?  ?  ?  ?  ?  ?  ?  ? ?  ?General Comments General comments (skin integrity, edema, etc.): dressing intact and dry at this time. ? ?  ?Exercises    ? ?Assessment/Plan  ?  ?PT Assessment Patient does not need any further PT services  ?PT Problem List Decreased strength;Decreased activity tolerance;Decreased balance;Decreased mobility;Decreased knowledge of use of DME;Decreased safety awareness;Decreased knowledge of precautions;Pain ? ?   ?  ?PT  Treatment Interventions     ? ?PT Goals (Current goals can be found in the Care Plan section)  ?Acute Rehab PT Goals ?Patient Stated Goal: Home at d/c ?PT Goal Formulation: All assessment and education complete, DC therapy ? ?  ?Frequency   ?  ? ? ?Co-evaluation   ?  ?  ?  ?  ? ? ?  ?AM-PAC PT "6 Clicks" Mobility  ?Outcome Measure Help needed turning from your back to your side while in a flat bed without using bedrails?: None ?Help needed moving from lying on your back to sitting on the side of a flat bed without using bedrails?: None ?Help needed moving to and from a bed to a chair (including a wheelchair)?: None ?Help needed standing up from a chair using your arms (e.g., wheelchair or bedside chair)?: None ?Help needed to walk in hospital room?: A Little ?Help needed climbing 3-5 steps with a railing? : A Little ?6 Click Score: 22 ? ?  ?End of Session Equipment Utilized During Treatment: Gait belt ?Activity Tolerance: Patient tolerated treatment well ?Patient left: in chair;with call bell/phone within reach ?Nurse Communication: Mobility status ?PT Visit Diagnosis: Unsteadiness on feet (R26.81);Pain ?Pain - part of body:  (back) ?  ? ?Time: 2563-8937 ?PT Time Calculation (min) (ACUTE ONLY): 20 min ? ? ?Charges:   PT Evaluation ?$PT Eval Low Complexity: 1 Low ?  ?  ?   ? ? ?Rolinda Roan, PT, DPT ?Acute Rehabilitation Services ?Secure Chat Preferred ?Office: [phone]  ? ?Mickel Baas  D Renarda Mullinix ?12/16/2021, 11:11 AM ?

## 2021-12-16 NOTE — Plan of Care (Signed)
Pt doing well. Pt given D/C instructions with verbal understanding. Rx was sent to the pharmacy by MD. Pt's incision is clean and dry with no sign of infection. Pt's IV was removed prior to D/C. Pt D/C'd home via wheelchair per MD order. Pt is stable @ D/C and has no other needs at this time. Maguadalupe Lata, RN  

## 2021-12-16 NOTE — Progress Notes (Signed)
Neurosurgery Service ?Progress Note ? ?Subjective: No acute events overnight, preop radicular pain improved  ? ?Objective: ?Vitals:  ? 12/15/21 1930 12/15/21 2318 12/16/21 0336 12/16/21 0750  ?BP: 138/76 125/68 125/63 110/63  ?Pulse: 88 85 70 75  ?Resp: '20 20 18 18  '$ ?Temp: 98.4 ?F (36.9 ?C) 97.8 ?F (36.6 ?C) (!) 97.5 ?F (36.4 ?C) 98.2 ?F (36.8 ?C)  ?TempSrc: Oral Oral Oral Oral  ?SpO2: 94% 96% 98% 98%  ?Weight:      ?Height:      ? ? ?Physical Exam: ?Strength 5/5 x4 and SILTx4 ? ?Assessment & Plan: ?66 y.o. man s/p L4-5 MIS lami / TLIF, L2-3 MIS lami / synovial cyst resection, recovering well. ? ?-discharge home today ? ?Marcello Moores A Marlo Goodrich  ?12/16/21 ?7:54 AM ? ?

## 2021-12-16 NOTE — Discharge Summary (Signed)
Discharge Summary ? ?Date of Admission: 12/15/2021 ? ?Date of Discharge: 12/16/21 ? ?Attending Physician: Emelda Brothers, MD ? ?Hospital Course: Patient was admitted following an uncomplicated U7-2 MIS lami / TLIF and L2-3 MIS lami for synovial cyst resection. They were recovered in PACU and transferred to [facility]. Their preop symptoms were completely resolved, their hospital course was uncomplicated and the patient was discharged home on 12/16/21. They will follow up in clinic with me in clinic in 2 weeks. ? ?Neurologic exam at discharge:  ?Strength 5/5 x4 and SILTx4  ? ?Discharge diagnosis: Lumbar stenosis, lumbar spondylolisthesis ? ?Judith Part, MD ?12/16/21 ?7:57 AM ? ? ?

## 2021-12-16 NOTE — Anesthesia Postprocedure Evaluation (Signed)
Anesthesia Post Note ? ?Patient: Chris Flynn ? ?Procedure(s) Performed: Lumbar Four-Five Minimally Invasive Laminectomy, Transforaminal Lumbar Interbody Fusion with Lumbar Two-Three Minimally Invasive Laminectomy for resection of synovial cyst ? ?  ? ?Patient location during evaluation: PACU ?Anesthesia Type: General ?Level of consciousness: awake and alert ?Pain management: pain level controlled ?Vital Signs Assessment: post-procedure vital signs reviewed and stable ?Respiratory status: spontaneous breathing, nonlabored ventilation, respiratory function stable and patient connected to nasal cannula oxygen ?Cardiovascular status: blood pressure returned to baseline and stable ?Postop Assessment: no apparent nausea or vomiting ?Anesthetic complications: no ? ? ?No notable events documented. ? ?Last Vitals:  ?Vitals:  ? 12/16/21 0336 12/16/21 0750  ?BP: 125/63 110/63  ?Pulse: 70 75  ?Resp: 18 18  ?Temp: (!) 36.4 ?C 36.8 ?C  ?SpO2: 98% 98%  ?  ?Last Pain:  ?Vitals:  ? 12/16/21 0750  ?TempSrc: Oral  ?PainSc:   ? ? ?  ?  ?  ?  ?  ?  ? ?Agency [facility] ? ? ? ? ?

## 2022-05-29 ENCOUNTER — Other Ambulatory Visit (HOSPITAL_COMMUNITY): Payer: Self-pay | Admitting: Neurological Surgery

## 2022-05-29 ENCOUNTER — Other Ambulatory Visit: Payer: Self-pay | Admitting: Neurological Surgery

## 2022-05-29 DIAGNOSIS — M5416 Radiculopathy, lumbar region: Secondary | ICD-10-CM

## 2022-06-10 ENCOUNTER — Ambulatory Visit (HOSPITAL_COMMUNITY)
Admission: RE | Admit: 2022-06-10 | Discharge: 2022-06-10 | Disposition: A | Discharge status: Home / Self Care | Payer: HMO | Source: Ambulatory Visit | Attending: Neurological Surgery | Admitting: Neurological Surgery

## 2022-06-10 DIAGNOSIS — M5416 Radiculopathy, lumbar region: Secondary | ICD-10-CM | POA: Diagnosis not present

## 2022-10-29 ENCOUNTER — Encounter: Payer: Self-pay | Admitting: Radiology

## 2023-01-07 IMAGING — CT CT HEAD W/O CM
4 series · 16 of 47 positions shown, 18 images · non-contrast
Comparison: 02/03/2020

CLINICAL DATA: Recent fall with headaches and neck pain, initial
encounter



[Series 2: head w o · axial · 0.49mm/px · z∈[+89,+209]mm · 7 of 32 slices shown, 9 images]
[im 4/32  brain]
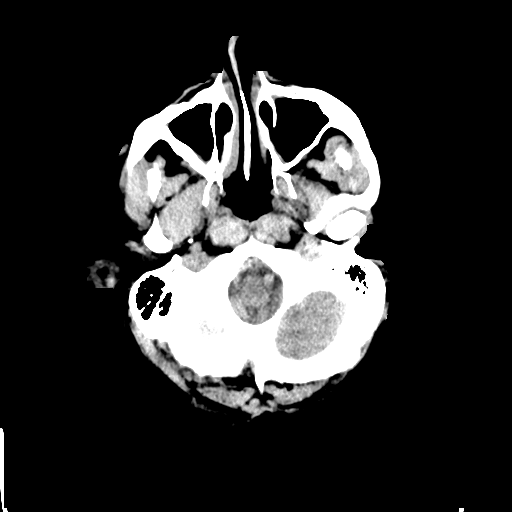
[im 4/32  bone]
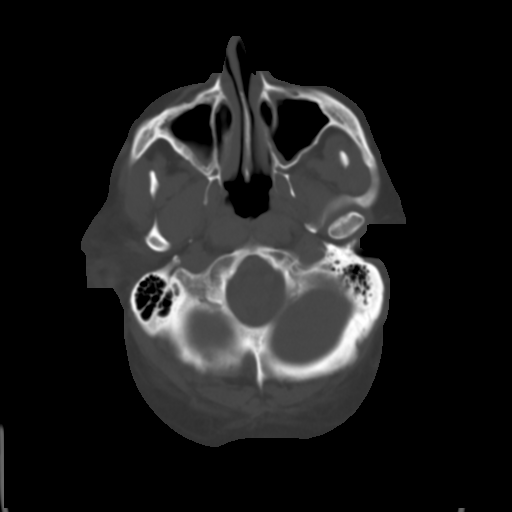
[im 8/32  brain]
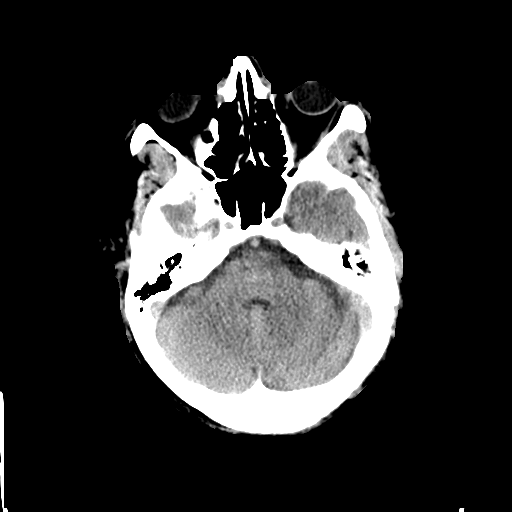
[im 12/32  brain]
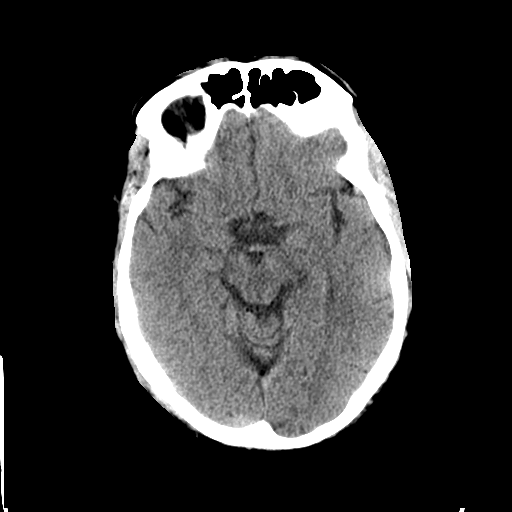
[im 16/32  brain]
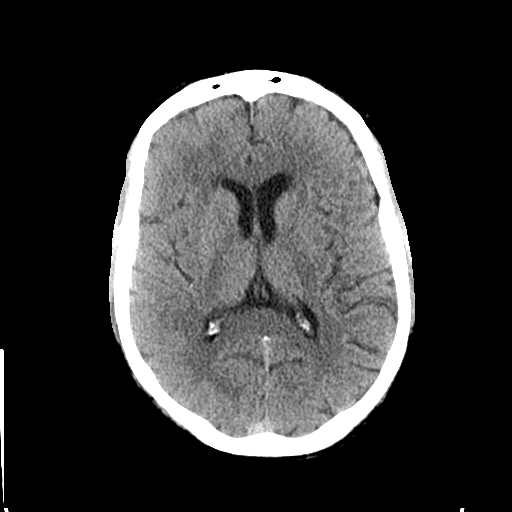
[im 20/32  brain]
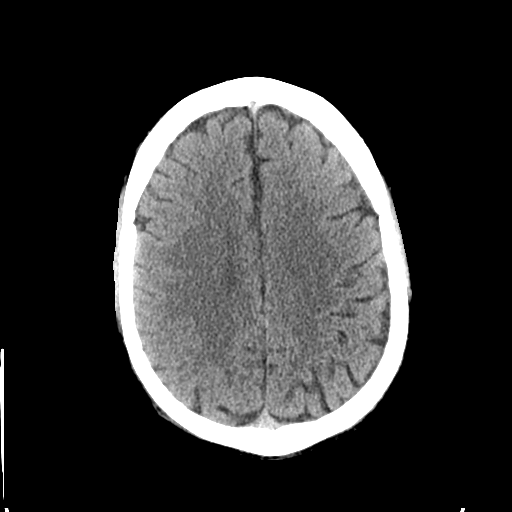
[im 20/32  bone]
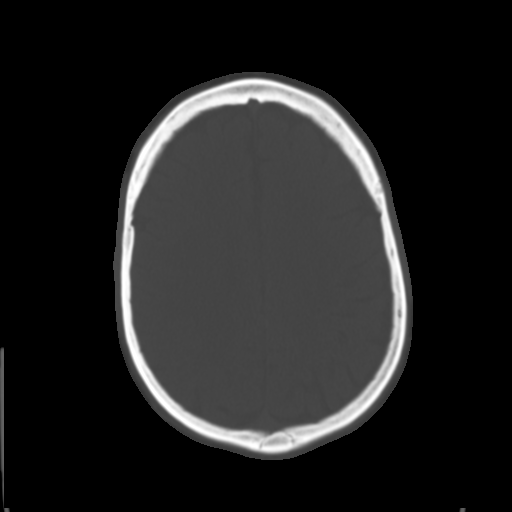
[im 24/32  brain]
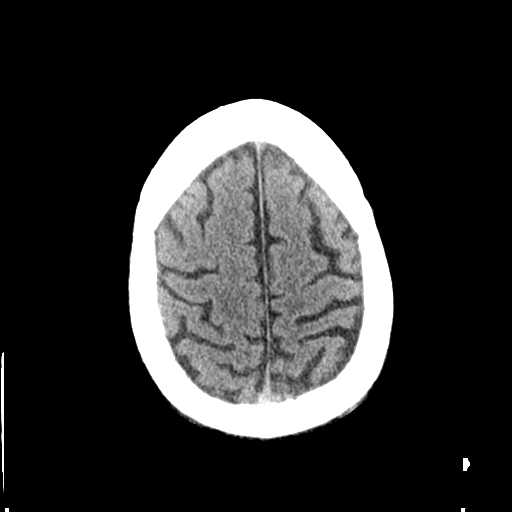
[im 28/32  brain]
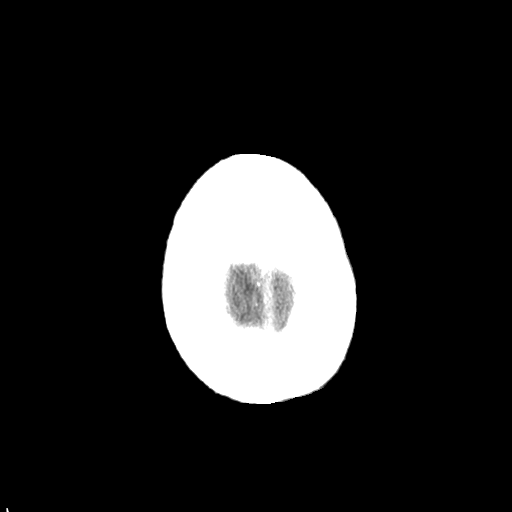

[Series 3: head bone · axial · 0.49mm/px · z∈[+88,+120]mm · 3 of 80 slices shown]
[im 8/80  bone]
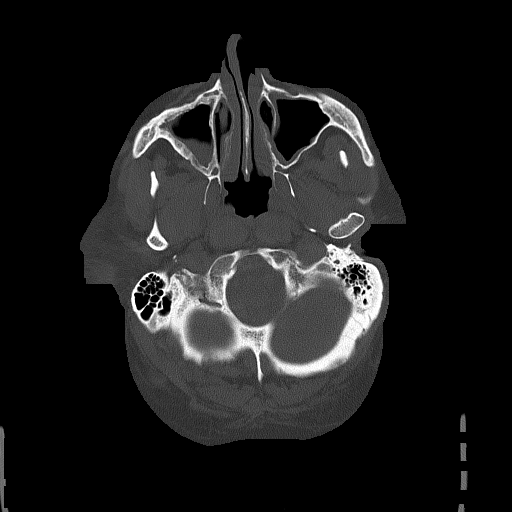
[im 16/80  bone]
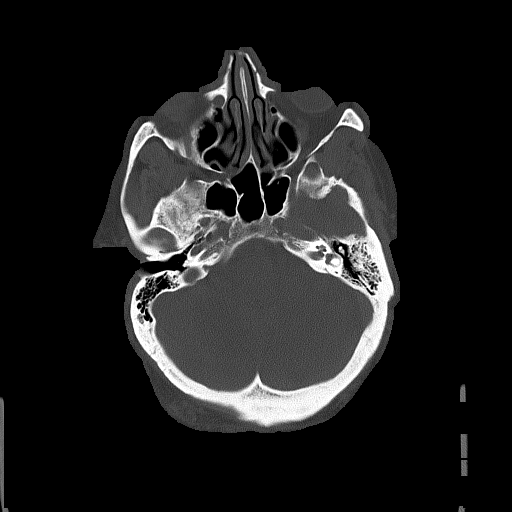
[im 24/80  bone]
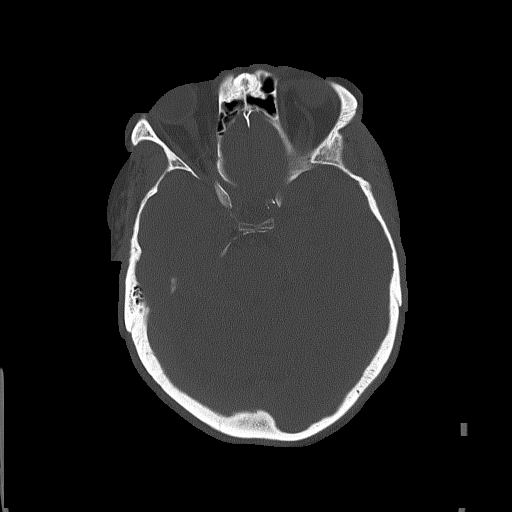

[Series 4: coronal soft · coronal · 0.35mm/px · 3 of 76 slices shown]
[im 26/76  brain]
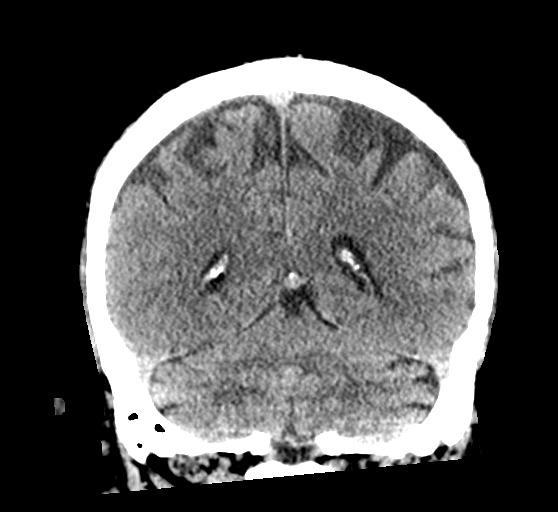
[im 34/76  brain]
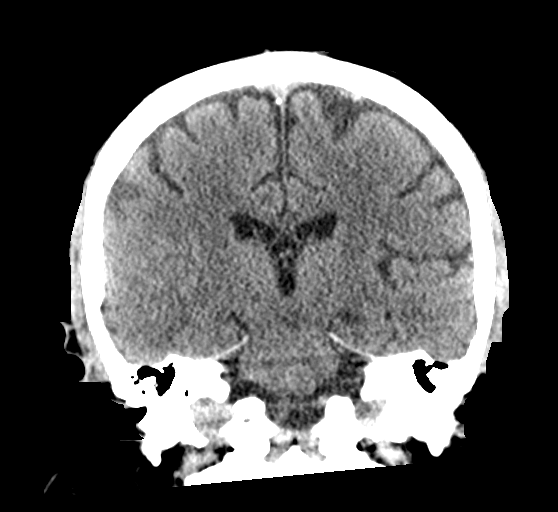
[im 42/76  brain]
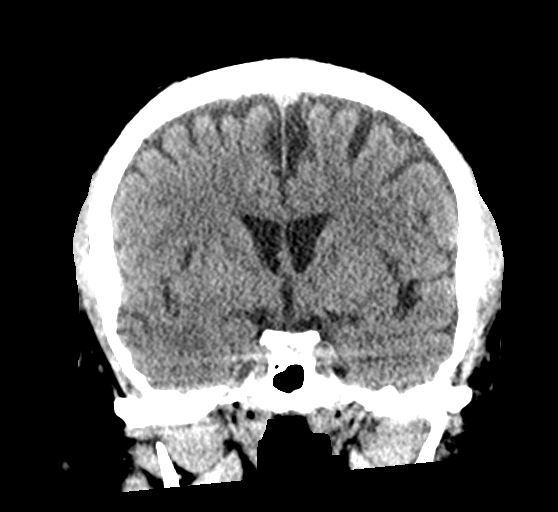

[Series 5: sagittal soft · sagittal · 0.35mm/px · 3 of 66 slices shown]
[im 22/66  brain]
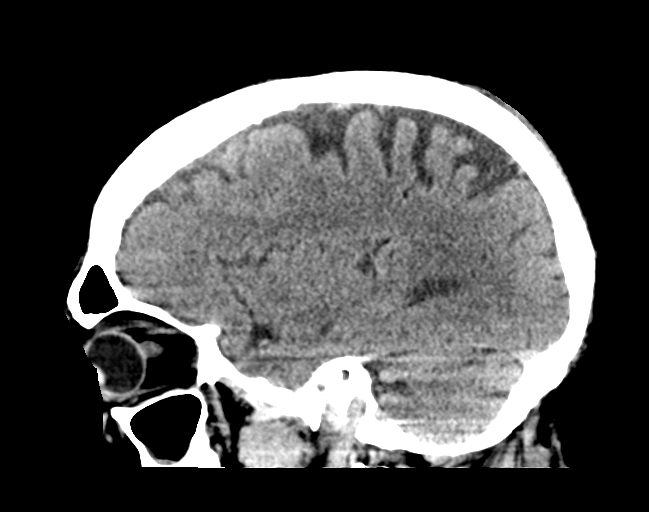
[im 33/66  brain]
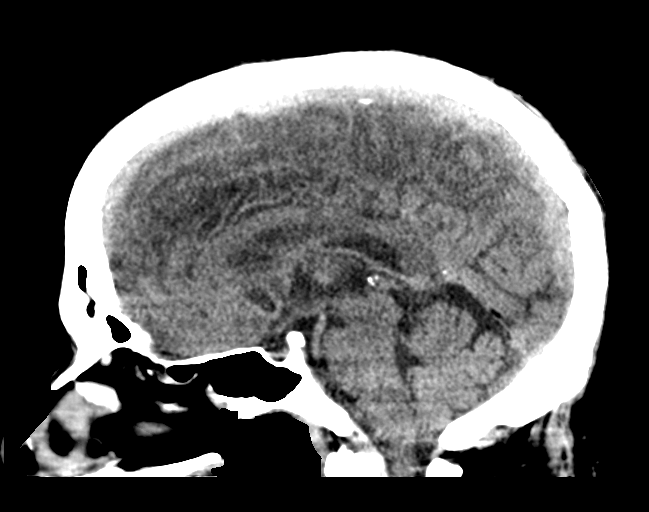
[im 44/66  brain]
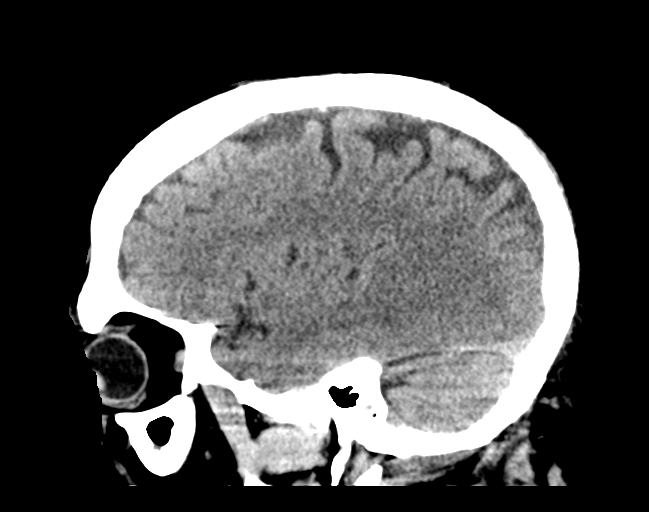

[16 of 47 positions shown; findings below may reference images not displayed]

FINDINGS: CT HEAD FINDINGS

Brain: No evidence of acute infarction, hemorrhage, hydrocephalus,
extra-axial collection or mass lesion/mass effect.

Vascular: No hyperdense vessel or unexpected calcification.

Skull: Normal. Negative for fracture or focal lesion.

Sinuses/Orbits: No acute finding.

Other: Soft tissue swelling is noted in the left posterior parietal
region near the vertex.

CT CERVICAL SPINE FINDINGS

Alignment: Normal.

Skull base and vertebrae: 7 cervical segments are well visualized.
Vertebral body height is well maintained. Changes of prior fusion
are noted at C5-6 with anterior fixation. Multilevel facet
hypertrophic changes are noted. Mild osteophytic changes are seen.
No acute fracture or acute facet abnormality is noted. The odontoid
is within normal limits.

Soft tissues and spinal canal: Surrounding soft tissue structures
show vascular calcifications. No other focal abnormality is noted.

Upper chest: Visualized lung apices are within normal limits.

Other: None
IMPRESSION: CT of the head: No acute intracranial abnormality noted.

Scalp swelling in the left posterior parietal region near the vertex
consistent with the recent injury.

CT of the cervical spine: Multilevel degenerative change without
acute abnormality.

Postsurgical changes at C5-6.

## 2023-01-07 IMAGING — CT CT CERVICAL SPINE W/O CM
3 of 4 series · 12 of 34 positions shown, 14 images · non-contrast
Comparison: 02/03/2020

CLINICAL DATA: Recent fall with headaches and neck pain, initial
encounter



[Series 5: sagittal bone · sagittal · 0.34mm/px · 5 of 70 slices shown, 6 images]
[im 24/70  bone]
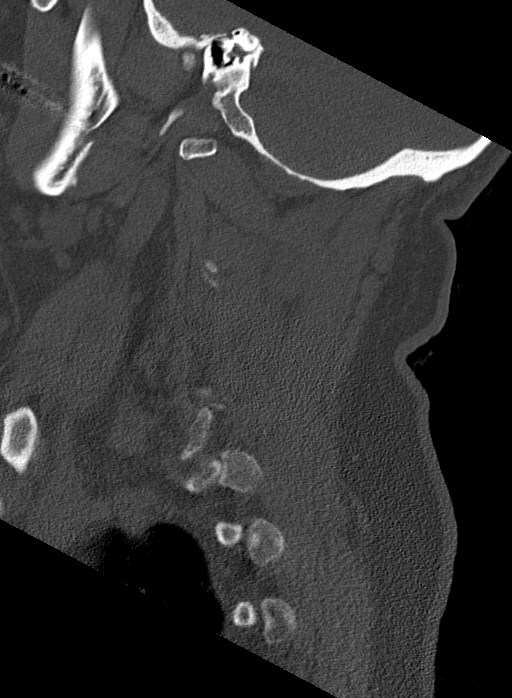
[im 29/70  bone]
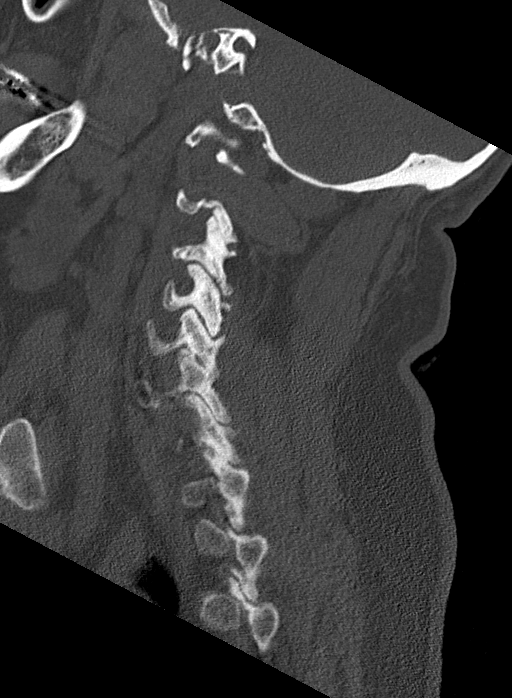
[im 35/70  soft-tissue]
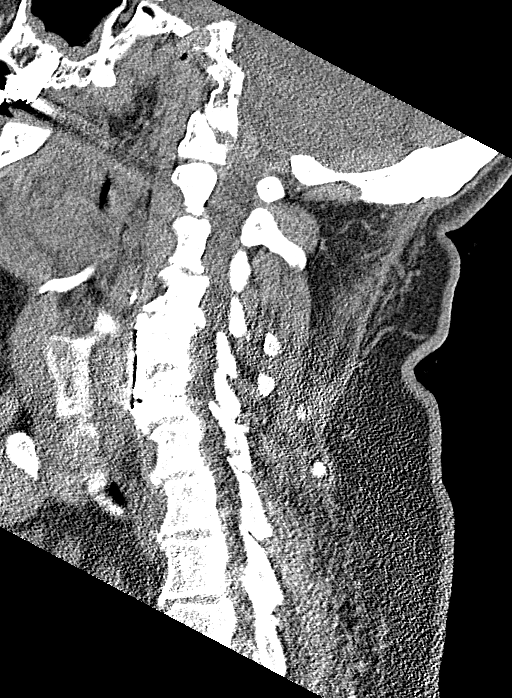
[im 35/70  bone]
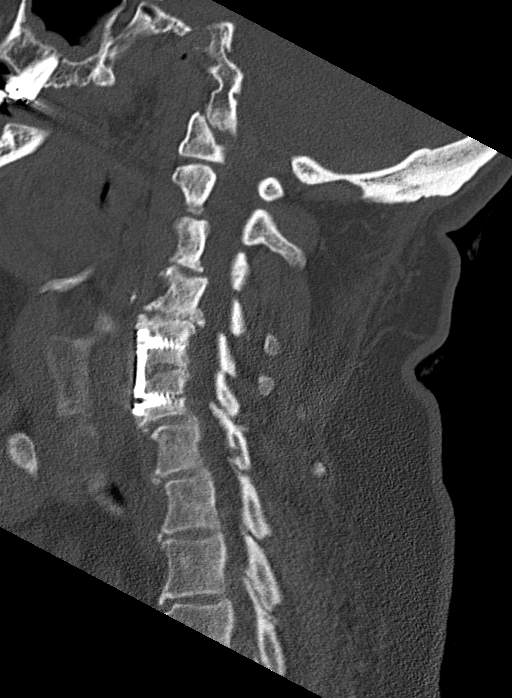
[im 41/70  bone]
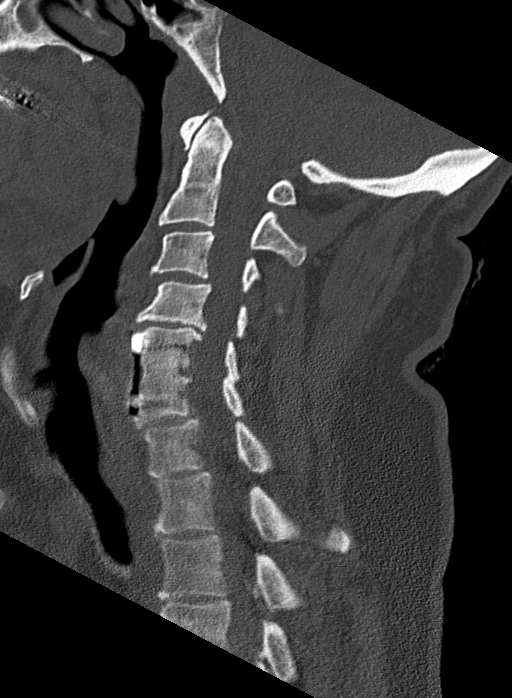
[im 47/70  bone]
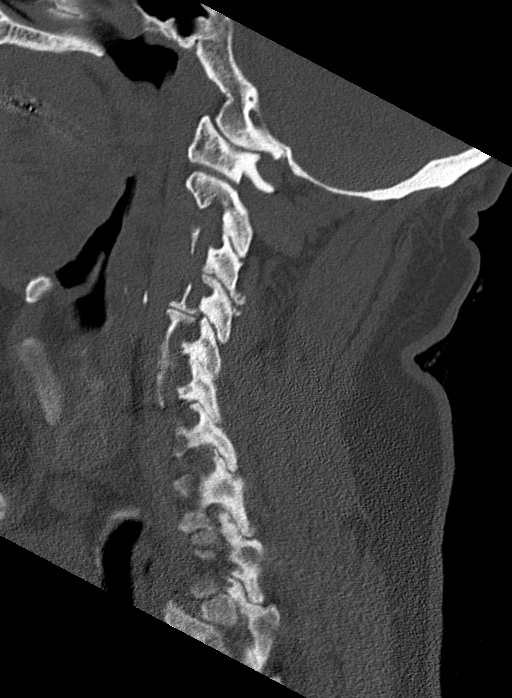

[Series 6: coronal bone · coronal · 0.27mm/px · 3 of 87 slices shown]
[im 28/87  bone]
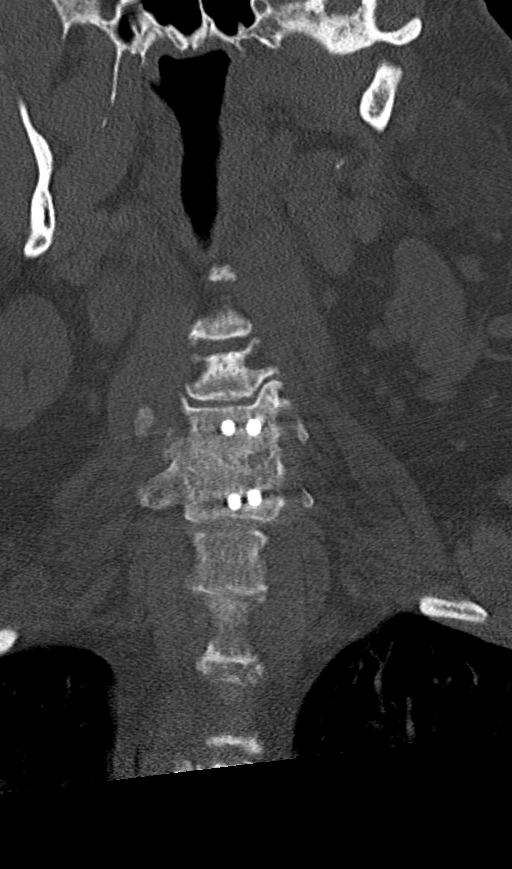
[im 38/87  bone]
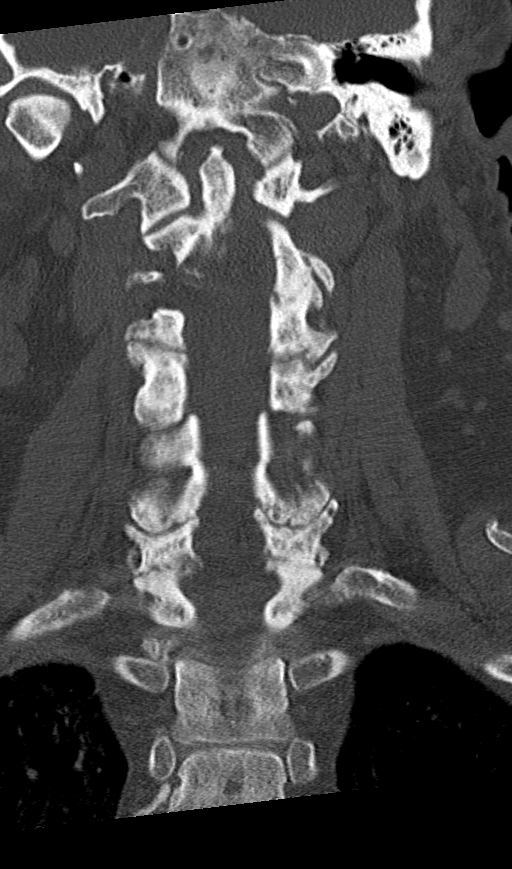
[im 49/87  bone]
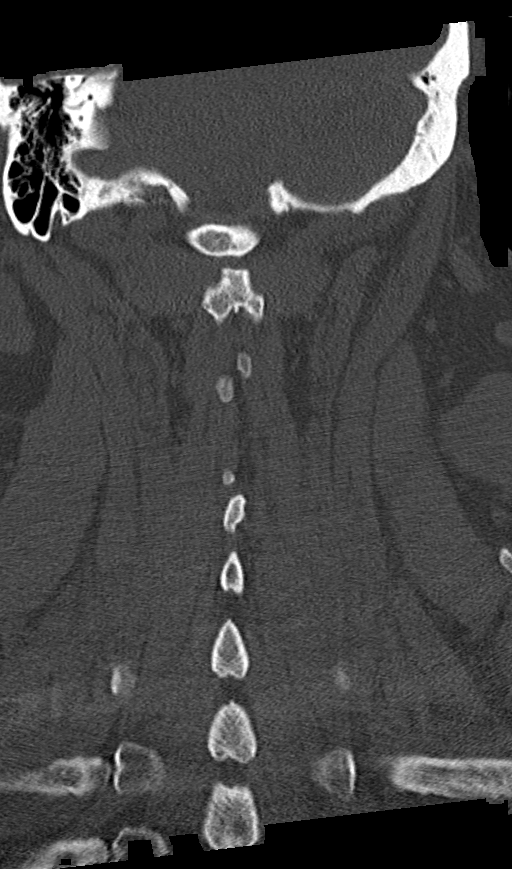

[Series 7: orthogonal axials · axial · 0.21mm/px · z∈[-68,+52]mm · 4 of 97 slices shown, 5 images]
[im 17/97  soft-tissue]
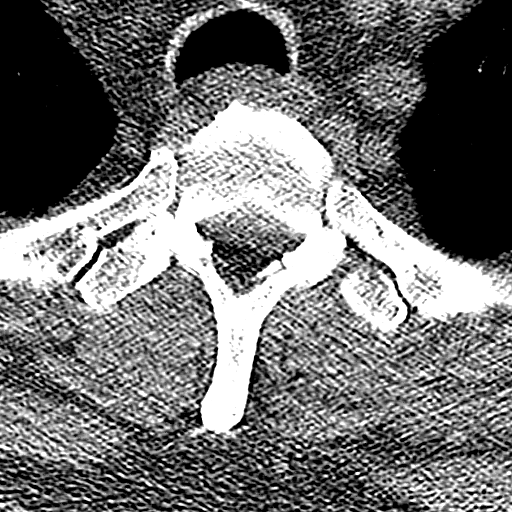
[im 17/97  bone]
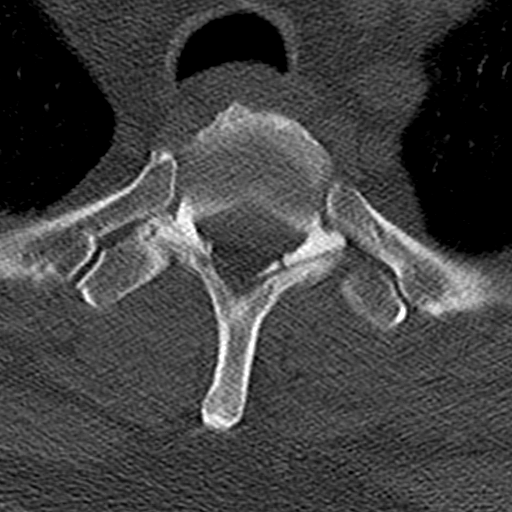
[im 33/97  bone]
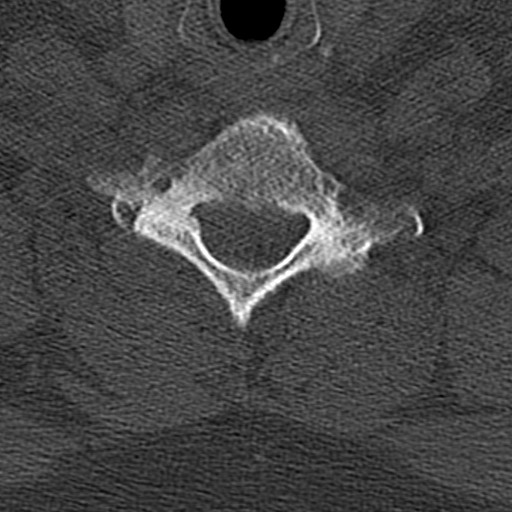
[im 65/97  bone]
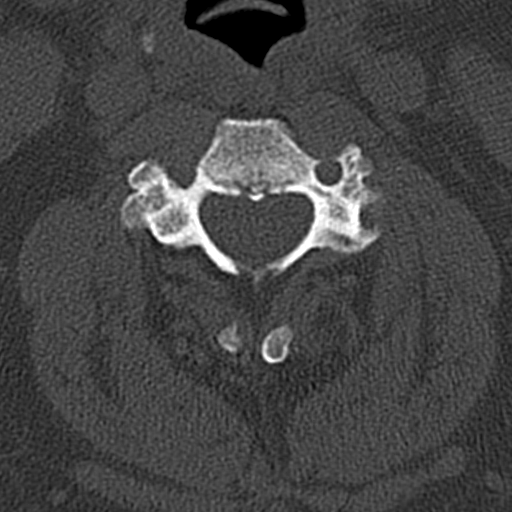
[im 81/97  bone]
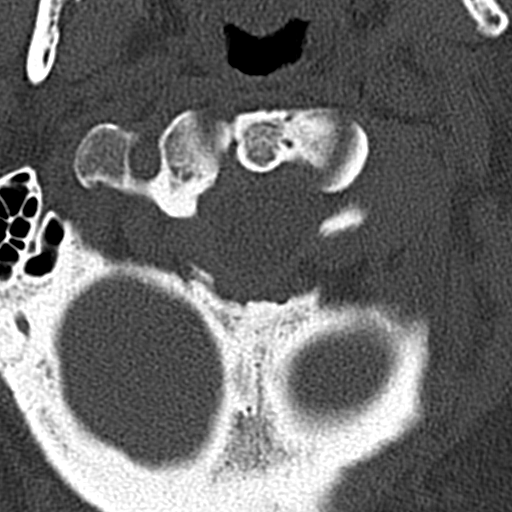

[12 of 34 positions shown; findings below may reference images not displayed]

FINDINGS: CT HEAD FINDINGS

Brain: No evidence of acute infarction, hemorrhage, hydrocephalus,
extra-axial collection or mass lesion/mass effect.

Vascular: No hyperdense vessel or unexpected calcification.

Skull: Normal. Negative for fracture or focal lesion.

Sinuses/Orbits: No acute finding.

Other: Soft tissue swelling is noted in the left posterior parietal
region near the vertex.

CT CERVICAL SPINE FINDINGS

Alignment: Normal.

Skull base and vertebrae: 7 cervical segments are well visualized.
Vertebral body height is well maintained. Changes of prior fusion
are noted at C5-6 with anterior fixation. Multilevel facet
hypertrophic changes are noted. Mild osteophytic changes are seen.
No acute fracture or acute facet abnormality is noted. The odontoid
is within normal limits.

Soft tissues and spinal canal: Surrounding soft tissue structures
show vascular calcifications. No other focal abnormality is noted.

Upper chest: Visualized lung apices are within normal limits.

Other: None
IMPRESSION: CT of the head: No acute intracranial abnormality noted.

Scalp swelling in the left posterior parietal region near the vertex
consistent with the recent injury.

CT of the cervical spine: Multilevel degenerative change without
acute abnormality.

Postsurgical changes at C5-6.

## 2023-03-29 ENCOUNTER — Telehealth: Payer: Self-pay | Admitting: Cardiology

## 2023-03-29 NOTE — Telephone Encounter (Signed)
Lab called and pt's PT was clotted.  Pt will need to be drawn tomorrow.  I left message with pt.  I do not see that you have seen but your name was on order.

## 2023-04-18 ENCOUNTER — Ambulatory Visit
Admission: EM | Admit: 2023-04-18 | Discharge: 2023-04-18 | Disposition: A | Discharge status: Home / Self Care | Payer: PPO

## 2023-04-18 ENCOUNTER — Encounter: Payer: Self-pay | Admitting: Emergency Medicine

## 2023-04-18 DIAGNOSIS — U071 COVID-19: Secondary | ICD-10-CM | POA: Diagnosis not present

## 2023-04-18 DIAGNOSIS — R059 Cough, unspecified: Secondary | ICD-10-CM

## 2023-04-18 MED ORDER — PAXLOVID (300/100) 20 X 150 MG & 10 X 100MG PO TBPK: 3.0000 | ORAL_TABLET | Freq: Two times a day (BID) | ORAL | 0 refills | Status: AC

## 2023-04-18 NOTE — Discharge Instructions (Signed)
Take medication as prescribed.  As discussed, you will need to hold your atorvastatin for 2 weeks. Increase fluids and allow for plenty of rest.  As discussed, try to drink at least 8-10 8 ounce glasses of water daily while symptoms persist. Recommend over-the-counter Tylenol as needed for pain, fever, general discomfort. Normal saline nasal spray throughout the day to help with nasal congestion and runny nose. For your cough, recommend using a humidifier in your bedroom at nighttime during sleep and sleeping elevated on pillows while cough symptoms persist. Go to the emergency department immediately if you experience a high fever greater than 102, worsening cough, shortness of breath, difficulty breathing, or other concerns. As discussed, continue to wear your mask while you are experiencing symptoms and until you complete the medication.  If you continue to experience symptoms after completing the medication, wear your mask for an additional 5 days.  You will need to remain home if you develop a fever.  Do not return to normal activities around others until you have been fever free for 24 hours with no medication. Follow-up as needed.

## 2023-04-18 NOTE — ED Provider Notes (Signed)
[facility]    CSN: [id number] Arrival date & time: 04/18/23  0856      History   Chief Complaint No chief complaint on file.   HPI Chris Flynn is a 67 y.o. male.   The history is provided by the patient.   Patient presents for complaints of cough, headache, and low-grade temperature that started over the past 24 hours.  Tmax 99.3.  States that he took 2 home COVID test, both were positive.  He denies ear pain, sore throat, nasal congestion, runny nose, wheezing, shortness of breath, difficulty breathing, chest pain, abdominal pain, nausea, vomiting, or diarrhea.  Patient reports that he has been taking Mucinex for his symptoms with some relief.  Patient denies any obvious known sick contacts. Past Medical History:  Diagnosis Date   Diabetes (HCC)    type 2   Dizziness    Headache    History of alcohol abuse    sober since 2017   Hypertension    Positive colorectal cancer screening using Cologuard test    Post concussive syndrome    PTSD (post-traumatic stress disorder)    Short-term memory loss     Patient Active Problem List   Diagnosis Date Noted   Spondylolisthesis of lumbar region 12/15/2021   Alcohol abuse 07/31/2021   New onset headache 11/09/2019   Memory loss 11/09/2019   Accident 11/09/2019   Gait abnormality 11/09/2019   Abscess 05/19/2017   Cellulitis of right arm 05/19/2017   Alcoholic pancreatitis 03/20/2017   Hypomagnesemia 03/19/2017   Thrombocytopenia (HCC) 03/19/2017   Hyponatremia    Transaminasemia    Rhabdomyolysis 03/18/2017   AKI (acute kidney injury) (HCC) 03/18/2017   Alcohol dependence with uncomplicated withdrawal (HCC) 03/18/2017   Hypocalcemia 03/18/2017   HYPERLIPIDEMIA 03/23/2007   HYPERTENSION 03/23/2007   ALLERGIC RHINITIS 03/23/2007    Past Surgical History:  Procedure Laterality Date   BACK SURGERY     5/22 8/22   COLONOSCOPY WITH PROPOFOL N/A 09/03/2021   Procedure: COLONOSCOPY WITH PROPOFOL;   Surgeon: Malissa Hippo, MD;  Location: [facility];  Service: Endoscopy;  Laterality: N/A;  8:55   INCISION AND DRAINAGE ABSCESS Right 05/20/2017   Procedure: INCISION AND DRAINAGE ABSCESS right elbow;  Surgeon: Vickki Hearing, MD;  Location: [facility];  Service: Orthopedics;  Laterality: Right;   NECK SURGERY     C5-6   POLYPECTOMY  09/03/2021   Procedure: POLYPECTOMY;  Surgeon: Malissa Hippo, MD;  Location: [facility];  Service: Endoscopy;;   TONSILLECTOMY     as a child   TRANSFORAMINAL LUMBAR INTERBODY FUSION W/ MIS 1 LEVEL N/A 12/15/2021   Procedure: Lumbar Four-Five Minimally Invasive Laminectomy, Transforaminal Lumbar Interbody Fusion with Lumbar Two-Three Minimally Invasive Laminectomy for resection of synovial cyst;  Surgeon: Jadene Pierini, MD;  Location: [facility];  Service: Neurosurgery;  Laterality: N/A;  Lumbar Four-Five Minimally Invasive Laminectomy, Transforaminal Lumbar Interbody Fusion with Lumbar Two-Three Minimally I       Home Medications    Prior to Admission medications   Medication Sig Start Date End Date Taking? Authorizing Provider  nirmatrelvir & ritonavir (PAXLOVID, 300/100,) 20 x 150 MG & 10 x 100MG  TBPK Take 3 tablets by mouth 2 (two) times daily for 5 days. 04/18/23 04/23/23 Yes Davionne Dowty-Warren, Sadie Haber, NP  Semaglutide,0.25 or 0.5MG /DOS, (OZEMPIC, 0.25 OR 0.5 MG/DOSE,) 2 MG/1.5ML SOPN Inject into the skin.   Yes [provider]  aspirin 325 MG tablet Take 1 tablet (325  mg total) by mouth daily. Restart on 12/21/21 12/16/21   Jadene Pierini, MD  atorvastatin (LIPITOR) 40 MG tablet Take 40 mg by mouth daily.    [provider]  carvedilol (COREG) 6.25 MG tablet Take 6.25 mg by mouth 2 (two) times daily. 08/18/19   [provider]  Cholecalciferol (DIALYVITE VITAMIN D 5000) 125 MCG (5000 UT) capsule Take 10,000 Units by mouth daily.    [provider]  clonazePAM (KLONOPIN) 1 MG tablet Take 1 mg by mouth 2 (two)  times daily. 08/16/19   [provider]  Cyanocobalamin (B-12) 3000 MCG CAPS Take 3,000 mcg by mouth daily.    [provider]  furosemide (LASIX) 40 MG tablet Take 40 mg by mouth daily as needed (fluid retention.).    [provider]  gabapentin (NEURONTIN) 600 MG tablet Take 600 mg by mouth 4 (four) times daily.    [provider]  Lidocaine HCl-Benzyl Alcohol (SALONPAS LIDOCAINE PLUS EX) Place 1 patch onto the skin daily. Lidocaine 4% Patch    [provider]  losartan (COZAAR) 100 MG tablet Take 100 mg by mouth daily. 08/06/19   [provider]  methocarbamol (ROBAXIN) 750 MG tablet Take 750 mg by mouth 4 (four) times daily.    [provider]  oxyCODONE (OXY IR/ROXICODONE) 5 MG immediate release tablet Take 1 tablet (5 mg total) by mouth every 4 (four) hours as needed (pain). 12/16/21   Jadene Pierini, MD  oxymetazoline (AFRIN) 0.05 % nasal spray Place 1 spray into both nostrils 2 (two) times daily as needed for congestion.    [provider]  tadalafil (CIALIS) 10 MG tablet Take 10 mg by mouth daily. 10/17/21   [provider]    Family History Family History  Problem Relation Age of Onset   COPD Mother    Lung cancer Father     Social History Social History   Tobacco Use   Smoking status: Never   Smokeless tobacco: Never  Vaping Use   Vaping status: Never Used  Substance Use Topics   Alcohol use: No    Comment: quit 2017   Drug use: No     Allergies   Codeine, Serotonin reuptake inhibitors (ssris), and Topamax [topiramate]   Review of Systems Review of Systems Per HPI  Physical Exam Triage Vital Signs ED Triage Vitals  Encounter Vitals Group     BP 04/18/23 0936 133/79     Systolic BP Percentile --      Diastolic BP Percentile --      Pulse Rate 04/18/23 0936 81     Resp 04/18/23 0936 18     Temp 04/18/23 0936 98.3 F (36.8 C)     Temp Source 04/18/23 0936 Oral     SpO2  04/18/23 0936 92 %     Weight --      Height --      Head Circumference --      Peak Flow --      Pain Score 04/18/23 0937 0     Pain Loc --      Pain Education --      Exclude from Growth Chart --    No data found.  Updated Vital Signs BP 133/79 (BP Location: Right Arm)   Pulse 81   Temp 98.3 F (36.8 C) (Oral)   Resp 18   SpO2 92%   Visual Acuity Right Eye Distance:   Left Eye Distance:   Bilateral Distance:  Right Eye Near:   Left Eye Near:    Bilateral Near:     Physical Exam Vitals and nursing note reviewed.  Constitutional:      General: He is not in acute distress.    Appearance: Normal appearance.  HENT:     Head: Normocephalic.     Right Ear: Tympanic membrane, ear canal and external ear normal.     Left Ear: Tympanic membrane, ear canal and external ear normal.     Nose: Nose normal.     Mouth/Throat:     Lips: Pink.     Mouth: Mucous membranes are moist.     Pharynx: Oropharynx is clear. Uvula midline. Posterior oropharyngeal erythema and postnasal drip present. No pharyngeal swelling.     Comments: Cobblestoning present to posterior oropharynx. Eyes:     Extraocular Movements: Extraocular movements intact.     Conjunctiva/sclera: Conjunctivae normal.     Pupils: Pupils are equal, round, and reactive to light.  Cardiovascular:     Rate and Rhythm: Normal rate and regular rhythm.     Pulses: Normal pulses.     Heart sounds: Normal heart sounds.  Pulmonary:     Effort: Pulmonary effort is normal. No respiratory distress.     Breath sounds: Normal breath sounds. No stridor. No wheezing, rhonchi or rales.  Abdominal:     General: Bowel sounds are normal.     Palpations: Abdomen is soft.     Tenderness: There is no abdominal tenderness.  Musculoskeletal:     Cervical back: Normal range of motion.  Lymphadenopathy:     Cervical: No cervical adenopathy.  Skin:    General: Skin is warm and dry.  Neurological:     General: No focal deficit  present.     Mental Status: He is alert and oriented to person, place, and time.  Psychiatric:        Mood and Affect: Mood normal.        Behavior: Behavior normal.      UC Treatments / Results  Labs (all labs ordered are listed, but only abnormal results are displayed) Labs Reviewed - No data to display  EKG   Radiology No results found.  Procedures Procedures (including critical care time)  Medications Ordered in UC Medications - No data to display  Initial Impression / Assessment and Plan / UC Course  I have reviewed the triage vital signs and the nursing notes.  Pertinent labs & imaging results that were available during my care of the patient were reviewed by me and considered in my medical decision making (see chart for details).  The patient is well-appearing, he is in no acute distress, vital signs are stable.  Patient with positive self-administered home COVID test.  Will treat patient with Paxlovid for the next 5 days.  Patient has been taking Mucinex, continue to continue at this time.  Supportive care recommendations were provided and discussed with the patient to include over-the-counter analgesics, increasing fluids, allowing for plenty of rest, and use of a humidifier.  Discussed isolation precautions with the patient.  Patient is in agreement with this plan of care and verbalizes understanding.  All questions were answered.  Patient stable for discharge.  Final Clinical Impressions(s) / UC Diagnoses   Final diagnoses:  Positive self-administered antigen test for COVID-19  Cough, unspecified type     Discharge Instructions      Take medication as prescribed.  As discussed, you will need to hold your atorvastatin for 2  weeks. Increase fluids and allow for plenty of rest.  As discussed, try to drink at least 8-10 8 ounce glasses of water daily while symptoms persist. Recommend over-the-counter Tylenol as needed for pain, fever, general discomfort. Normal  saline nasal spray throughout the day to help with nasal congestion and runny nose. For your cough, recommend using a humidifier in your bedroom at nighttime during sleep and sleeping elevated on pillows while cough symptoms persist. Go to the emergency department immediately if you experience a high fever greater than 102, worsening cough, shortness of breath, difficulty breathing, or other concerns. As discussed, continue to wear your mask while you are experiencing symptoms and until you complete the medication.  If you continue to experience symptoms after completing the medication, wear your mask for an additional 5 days.  You will need to remain home if you develop a fever.  Do not return to normal activities around others until you have been fever free for 24 hours with no medication. Follow-up as needed.      ED Prescriptions     Medication Sig Dispense Auth. Provider   nirmatrelvir & ritonavir (PAXLOVID, 300/100,) 20 x 150 MG & 10 x 100MG  TBPK Take 3 tablets by mouth 2 (two) times daily for 5 days. 30 tablet Shritha Bresee-Warren, Sadie Haber, NP      PDMP not reviewed this encounter.   Abran Cantor, NP 04/18/23 1020

## 2023-04-18 NOTE — ED Triage Notes (Signed)
Cough since yesterday cough productive at times.  Has been taking mucinex and tylenol.  Low grade fever.  Home covid test yesterday was positive.  Covid test this morning was positive.

## 2023-05-10 ENCOUNTER — Other Ambulatory Visit: Payer: Self-pay | Admitting: Neurological Surgery

## 2023-05-18 NOTE — Pre-Procedure Instructions (Signed)
Surgical Instructions   Your procedure is scheduled on May 31, 2023. Report to [facility] Main Entrance "A" at 5:30 A.M., then check in with the Admitting office. Any questions or running late day of surgery: call [phone]  Questions prior to your surgery date: call [phone], Monday-Friday, 8am-4pm. If you experience any cold or flu symptoms such as cough, fever, chills, shortness of breath, etc. between now and your scheduled surgery, please notify us at the above number.     Remember:  Do not eat or drink after midnight the night before your surgery    Take these medicines the morning of surgery with A SIP OF WATER: carvedilol (COREG)  gabapentin (NEURONTIN)  methocarbamol (ROBAXIN)    May take these medicines IF NEEDED: clonazePAM (KLONOPIN)  oxyCODONE-acetaminophen (PERCOCET)  oxymetazoline (AFRIN) nasal spray    Follow your surgeon's instructions on when to stop Aspirin.  If no instructions were given by your surgeon then you will need to call the office to get those instructions.     One week prior to surgery, STOP taking any Aleve, Naproxen, Ibuprofen, Motrin, Advil, Goody's, BC's, all herbal medications, fish oil, and non-prescription vitamins.   WHAT DO I DO ABOUT MY DIABETES MEDICATION?   STOP taking your Semaglutide one week prior to surgery. Do not take any doses after September 22nd.      HOW TO MANAGE YOUR DIABETES BEFORE AND AFTER SURGERY  Why is it important to control my blood sugar before and after surgery? Improving blood sugar levels before and after surgery helps healing and can limit problems. A way of improving blood sugar control is eating a healthy diet by:  Eating less sugar and carbohydrates  Increasing activity/exercise  Talking with your doctor about reaching your blood sugar goals High blood sugars (greater than 180 mg/dL) can raise your risk of infections and slow your recovery, so you will need to focus on controlling your  diabetes during the weeks before surgery. Make sure that the doctor who takes care of your diabetes knows about your planned surgery including the date and location.  How do I manage my blood sugar before surgery? Check your blood sugar at least 4 times a day, starting 2 days before surgery, to make sure that the level is not too high or low.  Check your blood sugar the morning of your surgery when you wake up and every 2 hours until you get to the Short Stay unit.  If your blood sugar is less than 70 mg/dL, you will need to treat for low blood sugar: Do not take insulin. Treat a low blood sugar (less than 70 mg/dL) with  cup of clear juice (cranberry or apple), 4 glucose tablets, OR glucose gel. Recheck blood sugar in 15 minutes after treatment (to make sure it is greater than 70 mg/dL). If your blood sugar is not greater than 70 mg/dL on recheck, call [phone] for further instructions. Report your blood sugar to the short stay nurse when you get to Short Stay.  If you are admitted to the hospital after surgery: Your blood sugar will be checked by the staff and you will probably be given insulin after surgery (instead of oral diabetes medicines) to make sure you have good blood sugar levels. The goal for blood sugar control after surgery is 80-180 mg/dL.                      Do NOT Smoke (Tobacco/Vaping) for 24 hours prior to  your procedure.  If you use a CPAP at night, you may bring your mask/headgear for your overnight stay.   You will be asked to remove any contacts, glasses, piercing's, hearing aid's, dentures/partials prior to surgery. Please bring cases for these items if needed.    Patients discharged the day of surgery will not be allowed to drive home, and someone needs to stay with them for 24 hours.  SURGICAL WAITING ROOM VISITATION Patients may have no more than 2 support people in the waiting area - these visitors may rotate.   Pre-op nurse will coordinate an  appropriate time for 1 ADULT support person, who may not rotate, to accompany patient in pre-op.  Children under the age of 69 must have an adult with them who is not the patient and must remain in the main waiting area with an adult.  If the patient needs to stay at the hospital during part of their recovery, the visitor guidelines for inpatient rooms apply.  Please refer to the [facility] website for the visitor guidelines for any additional information.   If you received a COVID test during your pre-op visit  it is requested that you wear a mask when out in public, stay away from anyone that may not be feeling well and notify your surgeon if you develop symptoms. If you have been in contact with anyone that has tested positive in the last 10 days please notify you surgeon.      Pre-operative 5 CHG Bathing Instructions   You can play a key role in reducing the risk of infection after surgery. Your skin needs to be as free of germs as possible. You can reduce the number of germs on your skin by washing with CHG (chlorhexidine gluconate) soap before surgery. CHG is an antiseptic soap that kills germs and continues to kill germs even after washing.   DO NOT use if you have an allergy to chlorhexidine/CHG or antibacterial soaps. If your skin becomes reddened or irritated, stop using the CHG and notify one of our RNs at [phone].   Please shower with the CHG soap starting 4 days before surgery using the following schedule:     Please keep in mind the following:  DO NOT shave, including legs and underarms, starting the day of your first shower.   You may shave your face at any point before/day of surgery.  Place clean sheets on your bed the day you start using CHG soap. Use a clean washcloth (not used since being washed) for each shower. DO NOT sleep with pets once you start using the CHG.   CHG Shower Instructions:  Wash your face and private area with normal soap. If you choose to  wash your hair, wash first with your normal shampoo.  After you use shampoo/soap, rinse your hair and body thoroughly to remove shampoo/soap residue.  Turn the water OFF and apply about 3 tablespoons (45 ml) of CHG soap to a CLEAN washcloth.  Apply CHG soap ONLY FROM YOUR NECK DOWN TO YOUR TOES (washing for 3-5 minutes)  DO NOT use CHG soap on face, private areas, open wounds, or sores.  Pay special attention to the area where your surgery is being performed.  If you are having back surgery, having someone wash your back for you may be helpful. Wait 2 minutes after CHG soap is applied, then you may rinse off the CHG soap.  Pat dry with a clean towel  Put on clean clothes/pajamas  If you choose to wear lotion, please use ONLY the CHG-compatible lotions on the back of this paper.   Additional instructions for the day of surgery: DO NOT APPLY any lotions, deodorants, cologne, or perfumes.   Do not bring valuables to the hospital. [facility] is not responsible for any belongings/valuables. Do not wear nail polish, gel polish, artificial nails, or any other type of covering on natural nails (fingers and toes) Do not wear jewelry or makeup Put on clean/comfortable clothes.  Please brush your teeth.  Ask your nurse before applying any prescription medications to the skin.     CHG Compatible Lotions   Aveeno Moisturizing lotion  Cetaphil Moisturizing Cream  Cetaphil Moisturizing Lotion  Clairol Herbal Essence Moisturizing Lotion, Dry Skin  Clairol Herbal Essence Moisturizing Lotion, Extra Dry Skin  Clairol Herbal Essence Moisturizing Lotion, Normal Skin  Curel Age Defying Therapeutic Moisturizing Lotion with Alpha Hydroxy  Curel Extreme Care Body Lotion  Curel Soothing Hands Moisturizing Hand Lotion  Curel Therapeutic Moisturizing Cream, Fragrance-Free  Curel Therapeutic Moisturizing Lotion, Fragrance-Free  Curel Therapeutic Moisturizing Lotion, Original Formula  Eucerin Daily  Replenishing Lotion  Eucerin Dry Skin Therapy Plus Alpha Hydroxy Crme  Eucerin Dry Skin Therapy Plus Alpha Hydroxy Lotion  Eucerin Original Crme  Eucerin Original Lotion  Eucerin Plus Crme Eucerin Plus Lotion  Eucerin TriLipid Replenishing Lotion  Keri Anti-Bacterial Hand Lotion  Keri Deep Conditioning Original Lotion Dry Skin Formula Softly Scented  Keri Deep Conditioning Original Lotion, Fragrance Free Sensitive Skin Formula  Keri Lotion Fast Absorbing Fragrance Free Sensitive Skin Formula  Keri Lotion Fast Absorbing Softly Scented Dry Skin Formula  Keri Original Lotion  Keri Skin Renewal Lotion Keri Silky Smooth Lotion  Keri Silky Smooth Sensitive Skin Lotion  Nivea Body Creamy Conditioning Oil  Nivea Body Extra Enriched Lotion  Nivea Body Original Lotion  Nivea Body Sheer Moisturizing Lotion Nivea Crme  Nivea Skin Firming Lotion  NutraDerm 30 Skin Lotion  NutraDerm Skin Lotion  NutraDerm Therapeutic Skin Cream  NutraDerm Therapeutic Skin Lotion  ProShield Protective Hand Cream  Provon moisturizing lotion  Please read over the following fact sheets that you were given.

## 2023-05-19 ENCOUNTER — Other Ambulatory Visit: Payer: Self-pay

## 2023-05-19 ENCOUNTER — Encounter (HOSPITAL_COMMUNITY): Payer: Self-pay

## 2023-05-19 ENCOUNTER — Encounter (HOSPITAL_COMMUNITY)
Admission: RE | Admit: 2023-05-19 | Discharge: 2023-05-19 | Disposition: A | Discharge status: Home / Self Care | Payer: PPO | Source: Ambulatory Visit | Attending: Neurological Surgery | Admitting: Neurological Surgery

## 2023-05-19 VITALS — BP 145/68 | HR 66 | Temp 98.2°F | Resp 18 | Ht 67.0 in | Wt 226.0 lb

## 2023-05-19 DIAGNOSIS — F4310 Post-traumatic stress disorder, unspecified: Secondary | ICD-10-CM | POA: Insufficient documentation

## 2023-05-19 DIAGNOSIS — Z01818 Encounter for other preprocedural examination: Secondary | ICD-10-CM | POA: Diagnosis present

## 2023-05-19 DIAGNOSIS — E1140 Type 2 diabetes mellitus with diabetic neuropathy, unspecified: Secondary | ICD-10-CM | POA: Diagnosis not present

## 2023-05-19 DIAGNOSIS — I10 Essential (primary) hypertension: Secondary | ICD-10-CM | POA: Diagnosis not present

## 2023-05-19 DIAGNOSIS — I252 Old myocardial infarction: Secondary | ICD-10-CM | POA: Diagnosis not present

## 2023-05-19 HISTORY — DX: Myoneural disorder, unspecified: G70.9

## 2023-05-19 HISTORY — DX: Other complications of anesthesia, initial encounter: T88.59XA

## 2023-05-19 HISTORY — DX: Unspecified osteoarthritis, unspecified site: M19.90

## 2023-05-19 LAB — CBC
HCT: 42.2 % (ref 39.0–52.0)
Hemoglobin: 14.6 g/dL (ref 13.0–17.0)
MCH: 34.2 pg — ABNORMAL HIGH (ref 26.0–34.0)
MCHC: 34.6 g/dL (ref 30.0–36.0)
MCV: 98.8 fL (ref 80.0–100.0)
Platelets: 151 10*3/uL (ref 150–400)
RBC: 4.27 MIL/uL (ref 4.22–5.81)
RDW: 13 % (ref 11.5–15.5)
WBC: 8.2 10*3/uL (ref 4.0–10.5)
nRBC: 0 % (ref 0.0–0.2)

## 2023-05-19 LAB — GLUCOSE, CAPILLARY: Glucose-Capillary: 171 mg/dL — ABNORMAL HIGH (ref 70–99)

## 2023-05-19 LAB — TYPE AND SCREEN
ABO/RH(D): A POS
Antibody Screen: NEGATIVE

## 2023-05-19 LAB — HEMOGLOBIN A1C
Hgb A1c MFr Bld: 6.1 % — ABNORMAL HIGH (ref 4.8–5.6)
Mean Plasma Glucose: 128.37 mg/dL

## 2023-05-19 LAB — SURGICAL PCR SCREEN
MRSA, PCR: NEGATIVE
Staphylococcus aureus: NEGATIVE

## 2023-05-19 NOTE — Progress Notes (Addendum)
.  PCP - Roe Rutherford, NP Cardiologist - Madan Badal,MD  PPM/ICD - denies Device Orders -  Rep Notified -   Chest x-ray -  EKG - 08/17/22 CE Stress Test - 06/15/22 ECHO - 06/19/22 Cardiac Cath - denies  Sleep Study - denies CPAP - none  Fasting Blood Sugar - 90 -110 Checks Blood Sugar one times a day  Last dose of GLP1 agonist-  05/17/23 GLP1 instructions: do not take Semaglutide after 05/23/23.  Blood Thinner Instructions: n/a Aspirin Instructions:pt states he will contact surgeon for instructions regarding when to stop Aspirin. He says he has a letter at home from Careers adviser with these instruction.   ERAS Protcol -no PRE-SURGERY Ensure or G2-   COVID TEST- Pt tested positive for Covid 04/18/23. He reports he tested negative for Covid on 05/09/23. He says he is feeling well today;denies having any fevers,chills,sore throat at present.  He is currently taking Amoxicillin and Prednisone for a sinus infection. He stated he had some green drainage but says "it is clearing up." Patient says he has approximately ten days remaining on his course of antibiotics. He had an E visit with his PCP for this on 05/14/23. This information reported to [employer]. Per Fayrene Fearing, as long as pt remains asymptomatic,he should be ok for surgery. Instructed pt to call [phone] Short stay number to report any fever,cough,chills,cold,flu symptoms or worsening sinusitis. Also told him to notify his surgeon.    Anesthesia review: no  Patient denies shortness of breath, fever, cough and chest pain at PAT appointment   All instructions explained to the patient, with a verbal understanding of the material. Patient agrees to go over the instructions while at home for a better understanding. Patient also instructed to wear a mask when out in public prior to surgery. The opportunity to ask questions was provided.

## 2023-05-20 NOTE — Progress Notes (Addendum)
Anesthesia Chart Review:   Case: [id number] Date/Time: 05/31/23 0715   Procedures:      Revision L1-S1 laminectomy and transforaminal lumbar interbody fusion with posterolateral instrumentation, exploration of hardware, and possible S2 alar iliac fixation     Application of O-Arm   Anesthesia type: General   Pre-op diagnosis: Spondylolisthesis   [facility]: [facility] ROOM 21 / [facility]   Surgeons: Jadene Pierini, MD       DISCUSSION: Patient is a 67 year old male scheduled for the above procedure.  History includes never smoker, HTN, DM2 (with diabetic neuropathy), PTSD, alcohol abuse (sober since 2017), postconcussive syndrome, short-term memory loss, spinal surgery (L4-5 minimally invasive laminectomy/TLIF, L2-3 laminectomy/synovial cyst resection 12/15/21). BMI is consistent with obesity.   He had home positive COVID-19 test on 04/18/23 and subsequent ED visit. No distress with stable vitals. Continue Mucinex and supportive care. Also prescribed 5 days Paxlovid. On 05/14/23 he had e-visit for sinusitis. No specific details noted but was prescribed a Medtrol dosepak (21 tablets) and Augmentin 875-125 mg BID x 10 days which he should complete prior to surgery. At 05/19/23 PAT RN visit, he reported feeling well and that green drainage was clearing up. He has follow-up scheduled on 05/25/23 with Roe Rutherford, NP.   He was evaluated by [facility] Cardiologist Dr. Fransisco Beau in 2023 for potation's and atypical chest pain. Subsequently, he had echocardiogram, nuclear stress test and Holter monitor ~ 05/2022. Stress test was negative for demonstration of inducible ischemia. Echocardiogram showed preserved ejection fraction, mild LVH, trace AR. Six day Holter monitor showed 4 beats run of ventricular quadruplets, less than 0.01% PVC, 0.02% PACs. He declined a sleep evaluation. HTN controlled. Per last visit on 08/17/22, follow-up in one year planned.  He reported that he will review ASA instructions that were given to  him by the surgeon. His last semaglutide was 05/17/23.  Will plan to follow-up 05/25/23 PCP visit note. Anesthesia team to evaluate on the day of surgery. Lab could not locate BMP specimen from PAT, so he weill need a BMP on arrival.   ADDENDUM 05/27/23 6:20 PM:  Patient was re-evaluated by PCP Roe Rutherford, NP on 05/25/23 for follow-up of chronic medical conditions. He told her about surgery plans and treatment for COVID in August then possible residual symptoms earlier this month with antibiotic and steroids. His lung were clear, but given reported post-COVID non-productive cough and upcoming surgery, she ordered a CXR to rule out acute abnormality. She documented CXR and "normal." Symbicort BID prescribed, as well as albuterol MDI if needed. CXR radiologist report is still pending, but I also reviewed images with anesthesiologist Corky Sox, MD.    VS: BP (!) 145/68   Pulse 66   Temp 36.8 C (Oral)   Resp 18   Ht 5\' 7"  (1.702 m)   Wt 102.5 kg   SpO2 95%   BMI 35.40 kg/m    PROVIDERS: Roe Rutherford, NP is PCP  Fawn Kirk, MD is cardiologist    LABS: Labs reviewed: Acceptable for surgery. For BMP on the day of surgery. CMP on 01/26/23 showed Cr 1.21, BUN 25, AST 28, ALT 31.  (all labs ordered are listed, but only abnormal results are displayed)  Labs Reviewed  GLUCOSE, CAPILLARY - Abnormal; Notable for the following components:      Result Value   Glucose-Capillary 171 (*)    All other components within normal limits  HEMOGLOBIN A1C - Abnormal; Notable for the following components:   Hgb A1c MFr Bld  6.1 (*)    All other components within normal limits  CBC - Abnormal; Notable for the following components:   MCH 34.2 (*)    All other components within normal limits  SURGICAL PCR SCREEN  TYPE AND SCREEN     IMAGES: MRI L-spine 06/10/22: IMPRESSION: 1. Interval right laminectomy and synovial cyst excision at L2-L3 with resolved spinal canal stenosis and right  subarticular zone effacement. Unchanged moderate to severe right neural foraminal stenosis. 2. Interval posterior instrumented fusion and decompression at L4-L5 without residual spinal canal or neural foraminal stenosis. 3. Adjacent segment disease at L3-L4 has progressed since 10/22/2021, now resulting in moderate to severe spinal canal stenosis and mild-to-moderate bilateral neural foraminal stenosis. 4. Progressed degenerative marrow edema at L1 through L3. 5. Disc protrusion at L1-L2 appears slightly decreased in size with slightly improved spinal canal stenosis at this level.    EKG: EKG 08/17/22 ([facility]):  Sinus Rhythm  -Old anteroseptal infarct.    CV: Mobile Telemetry 06/19/22 ([facility]): IMPRESSION: - The patient was monitored for a total of 6d 23h, underlying rhythm is Sinus. The minimum heart rate was 64 bpm; the maximum 128 bpm; the average 88 bpm. No Atrial fibrillation/Atrial flutter, no high grade AV block or pathological pauses.  - Total count of Ventricular Tachycardia (VT): 1 episode(s). Longest VT: 4 beats on Day 6 / 04:19:15 am. Fastest VT: 182 bpm on There were a total of 14 PVCs with 1 morphologies and 0 couplets. Overall PVC Burden at < 0.01 %  - There were a total of 0 Other Beats. There were 0 total number of paced beats.  - There were a total of 215 PSVCs with 1 morphologies and 0 couplets. Overall PSVC Burden at 0.02 %  - There is a total of 11 patient events- associated with NSR    Echo 06/19/22 ([facility]): Left Ventricle: Left ventricle size is normal.    Left Ventricle: Systolic function is normal. EF: 55-60%.    Left Ventricle: There is mild concentric hypertrophy. Sigmoid septum,  no LVOT gradient.    Right Ventricle: Right ventricle size is normal.    Right Ventricle: Systolic function is normal.    Aortic Valve: Trace aortic valve regurgitation with centrally directed  jet.   Mitral Valve: There is trace regurgitation.    Nuclear stress  test 06/15/22 ([facility]): Impression:   1. Transient atypical chest pain with stress.  2. No ST changes to suggest ischemia.  3. Normal LV systolic function and wall motion.  4. No ischemia by perfusion imaging.    US Carotid 10/09/20 ([facility]): IMPRESSION:  1. No hemodynamically significant stenosis on either side.  2.  Mild scattered plaque.  3.  Both vertebral arteries are patent with antegrade flow.    Past Medical History:  Diagnosis Date   Arthritis    Complication of anesthesia    difficulty waking up after back surgery   Diabetes (HCC)    type 2   Dizziness    Headache    History of alcohol abuse    sober since 2017   Hypertension    Neuromuscular disorder (HCC)    diabetic neuropathy in feet   Positive colorectal cancer screening using Cologuard test    Post concussive syndrome    PTSD (post-traumatic stress disorder)    Short-term memory loss     Past Surgical History:  Procedure Laterality Date   BACK SURGERY     5/22 8/22   COLONOSCOPY WITH PROPOFOL  N/A 09/03/2021   Procedure: COLONOSCOPY WITH PROPOFOL;  Surgeon: Malissa Hippo, MD;  Location: [facility];  Service: Endoscopy;  Laterality: N/A;  8:55   INCISION AND DRAINAGE ABSCESS Right 05/20/2017   Procedure: INCISION AND DRAINAGE ABSCESS right elbow;  Surgeon: Vickki Hearing, MD;  Location: [facility] ORS;  Service: Orthopedics;  Laterality: Right;   NECK SURGERY     C5-6   POLYPECTOMY  09/03/2021   Procedure: POLYPECTOMY;  Surgeon: Malissa Hippo, MD;  Location: [facility];  Service: Endoscopy;;   TONSILLECTOMY     as a child   TRANSFORAMINAL LUMBAR INTERBODY FUSION W/ MIS 1 LEVEL N/A 12/15/2021   Procedure: Lumbar Four-Five Minimally Invasive Laminectomy, Transforaminal Lumbar Interbody Fusion with Lumbar Two-Three Minimally Invasive Laminectomy for resection of synovial cyst;  Surgeon: Jadene Pierini, MD;  Location: [facility] OR;  Service: Neurosurgery;  Laterality: N/A;  Lumbar Four-Five  Minimally Invasive Laminectomy, Transforaminal Lumbar Interbody Fusion with Lumbar Two-Three Minimally I    MEDICATIONS:  aspirin 325 MG tablet   atorvastatin (LIPITOR) 40 MG tablet   carvedilol (COREG) 12.5 MG tablet   Cholecalciferol (DIALYVITE VITAMIN D 5000) 125 MCG (5000 UT) capsule   clonazePAM (KLONOPIN) 1 MG tablet   Cyanocobalamin (B-12) 3000 MCG CAPS   furosemide (LASIX) 40 MG tablet   gabapentin (NEURONTIN) 600 MG tablet   losartan (COZAAR) 100 MG tablet   magnesium oxide (MAG-OX) 400 (240 Mg) MG tablet   methocarbamol (ROBAXIN) 750 MG tablet   oxyCODONE (OXY IR/ROXICODONE) 5 MG immediate release tablet   oxyCODONE-acetaminophen (PERCOCET) 10-325 MG tablet   oxymetazoline (AFRIN) 0.05 % nasal spray   potassium chloride (KLOR-CON) 10 MEQ tablet   Semaglutide,0.25 or 0.5MG /DOS, (OZEMPIC, 0.25 OR 0.5 MG/DOSE,) 2 MG/1.5ML SOPN   tadalafil (CIALIS) 10 MG tablet   No current facility-administered medications for this encounter.    Shonna Chock, PA-C Surgical Short Stay/Anesthesiology [facility] Phone [phone] [facility] Phone [phone] 05/20/2023 4:03 PM

## 2023-05-25 ENCOUNTER — Other Ambulatory Visit (HOSPITAL_COMMUNITY): Payer: Self-pay | Admitting: Adult Health Nurse Practitioner

## 2023-05-25 ENCOUNTER — Ambulatory Visit (HOSPITAL_COMMUNITY)
Admission: RE | Admit: 2023-05-25 | Discharge: 2023-05-25 | Disposition: A | Discharge status: Home / Self Care | Payer: PPO | Source: Ambulatory Visit | Attending: Adult Health Nurse Practitioner | Admitting: Adult Health Nurse Practitioner

## 2023-05-25 DIAGNOSIS — R053 Chronic cough: Secondary | ICD-10-CM | POA: Insufficient documentation

## 2023-05-26 ENCOUNTER — Other Ambulatory Visit: Payer: Self-pay | Admitting: Neurological Surgery

## 2023-05-31 ENCOUNTER — Inpatient Hospital Stay (HOSPITAL_COMMUNITY): Payer: PPO | Admitting: Vascular Surgery

## 2023-05-31 ENCOUNTER — Inpatient Hospital Stay (HOSPITAL_COMMUNITY): Payer: PPO

## 2023-05-31 ENCOUNTER — Inpatient Hospital Stay (HOSPITAL_COMMUNITY)
Admission: AD | Admit: 2023-05-31 | Discharge: 2023-06-01 | DRG: 428 | Disposition: A | Discharge status: Home / Self Care | Payer: PPO | Attending: Neurological Surgery | Admitting: Neurological Surgery

## 2023-05-31 ENCOUNTER — Other Ambulatory Visit: Payer: Self-pay

## 2023-05-31 ENCOUNTER — Encounter (HOSPITAL_COMMUNITY)
Admission: AD | Disposition: A | Discharge status: Home / Self Care | Payer: Self-pay | Source: Home / Self Care | Attending: Neurological Surgery

## 2023-05-31 ENCOUNTER — Encounter (HOSPITAL_COMMUNITY): Payer: Self-pay | Admitting: Neurological Surgery

## 2023-05-31 DIAGNOSIS — M7138 Other bursal cyst, other site: Secondary | ICD-10-CM | POA: Diagnosis present

## 2023-05-31 DIAGNOSIS — Z01818 Encounter for other preprocedural examination: Secondary | ICD-10-CM

## 2023-05-31 DIAGNOSIS — N189 Chronic kidney disease, unspecified: Secondary | ICD-10-CM | POA: Diagnosis not present

## 2023-05-31 DIAGNOSIS — M4316 Spondylolisthesis, lumbar region: Secondary | ICD-10-CM

## 2023-05-31 DIAGNOSIS — M159 Polyosteoarthritis, unspecified: Secondary | ICD-10-CM | POA: Diagnosis present

## 2023-05-31 DIAGNOSIS — E1140 Type 2 diabetes mellitus with diabetic neuropathy, unspecified: Secondary | ICD-10-CM | POA: Diagnosis present

## 2023-05-31 DIAGNOSIS — E1122 Type 2 diabetes mellitus with diabetic chronic kidney disease: Secondary | ICD-10-CM

## 2023-05-31 DIAGNOSIS — Z801 Family history of malignant neoplasm of trachea, bronchus and lung: Secondary | ICD-10-CM

## 2023-05-31 DIAGNOSIS — F4310 Post-traumatic stress disorder, unspecified: Secondary | ICD-10-CM | POA: Diagnosis present

## 2023-05-31 DIAGNOSIS — F1011 Alcohol abuse, in remission: Secondary | ICD-10-CM | POA: Diagnosis present

## 2023-05-31 DIAGNOSIS — Z825 Family history of asthma and other chronic lower respiratory diseases: Secondary | ICD-10-CM | POA: Diagnosis not present

## 2023-05-31 DIAGNOSIS — M5416 Radiculopathy, lumbar region: Secondary | ICD-10-CM | POA: Diagnosis present

## 2023-05-31 DIAGNOSIS — Z981 Arthrodesis status: Secondary | ICD-10-CM

## 2023-05-31 DIAGNOSIS — M48062 Spinal stenosis, lumbar region with neurogenic claudication: Secondary | ICD-10-CM | POA: Diagnosis present

## 2023-05-31 DIAGNOSIS — I10 Essential (primary) hypertension: Secondary | ICD-10-CM | POA: Diagnosis present

## 2023-05-31 HISTORY — PX: TRANSFORAMINAL LUMBAR INTERBODY FUSION (TLIF) WITH PEDICLE SCREW FIXATION 2 LEVEL: SHX6142

## 2023-05-31 LAB — BASIC METABOLIC PANEL
Anion gap: 14 (ref 5–15)
BUN: 11 mg/dL (ref 8–23)
CO2: 21 mmol/L — ABNORMAL LOW (ref 22–32)
Calcium: 8.8 mg/dL — ABNORMAL LOW (ref 8.9–10.3)
Chloride: 105 mmol/L (ref 98–111)
Creatinine, Ser: 1.09 mg/dL (ref 0.61–1.24)
GFR, Estimated: >60 mL/min (ref >60)
Glucose, Bld: 99 mg/dL (ref 70–99)
Potassium: 3.8 mmol/L (ref 3.5–5.1)
Sodium: 140 mmol/L (ref 135–145)

## 2023-05-31 LAB — GLUCOSE, CAPILLARY
Glucose-Capillary: 97 mg/dL (ref 70–99)
Glucose-Capillary: 175 mg/dL — ABNORMAL HIGH (ref 70–99)
Glucose-Capillary: 189 mg/dL — ABNORMAL HIGH (ref 70–99)
Glucose-Capillary: 148 mg/dL — ABNORMAL HIGH (ref 70–99)

## 2023-05-31 SURGERY — TRANSFORAMINAL LUMBAR INTERBODY FUSION (TLIF) WITH PEDICLE SCREW FIXATION 2 LEVEL
Anesthesia: General

## 2023-05-31 MED ORDER — CHLORHEXIDINE GLUCONATE 0.12 % MT SOLN
15.0000 mL | Freq: Once | OROMUCOSAL | Status: AC
  Administered 2023-05-31: 15 mL via OROMUCOSAL
  Filled 2023-05-31: qty 15

## 2023-05-31 MED ORDER — OXYCODONE HCL 5 MG/5ML PO SOLN: 5.0000 mg | Freq: Once | ORAL | Status: DC | PRN

## 2023-05-31 MED ORDER — ACETAMINOPHEN 650 MG RE SUPP: 650.0000 mg | RECTAL | Status: DC | PRN

## 2023-05-31 MED ORDER — CHLORHEXIDINE GLUCONATE CLOTH 2 % EX PADS: 6.0000 | MEDICATED_PAD | Freq: Once | CUTANEOUS | Status: DC

## 2023-05-31 MED ORDER — HYDROMORPHONE HCL 1 MG/ML IJ SOLN: 1.0000 mg | INTRAMUSCULAR | Status: DC | PRN

## 2023-05-31 MED ORDER — DEXAMETHASONE SODIUM PHOSPHATE 10 MG/ML IJ SOLN
INTRAMUSCULAR | Status: AC
  Filled 2023-05-31: qty 1

## 2023-05-31 MED ORDER — OXYCODONE HCL 5 MG PO TABS: 5.0000 mg | ORAL_TABLET | ORAL | Status: DC | PRN

## 2023-05-31 MED ORDER — THROMBIN 5000 UNITS EX SOLR
CUTANEOUS | Status: AC
  Filled 2023-05-31: qty 5000

## 2023-05-31 MED ORDER — POTASSIUM CHLORIDE CRYS ER 10 MEQ PO TBCR
10.0000 meq | EXTENDED_RELEASE_TABLET | Freq: Two times a day (BID) | ORAL | Status: DC
  Administered 2023-05-31 – 2023-06-01 (×3): 10 meq via ORAL
  Filled 2023-05-31 (×3): qty 1

## 2023-05-31 MED ORDER — ORAL CARE MOUTH RINSE: 15.0000 mL | Freq: Once | OROMUCOSAL | Status: AC

## 2023-05-31 MED ORDER — SODIUM CHLORIDE 0.9% FLUSH: 3.0000 mL | Freq: Two times a day (BID) | INTRAVENOUS | Status: DC

## 2023-05-31 MED ORDER — CARVEDILOL 12.5 MG PO TABS
12.5000 mg | ORAL_TABLET | Freq: Two times a day (BID) | ORAL | Status: DC
  Administered 2023-05-31 – 2023-06-01 (×2): 12.5 mg via ORAL
  Filled 2023-05-31 (×2): qty 1

## 2023-05-31 MED ORDER — FENTANYL CITRATE (PF) 100 MCG/2ML IJ SOLN: 25.0000 ug | INTRAMUSCULAR | Status: DC | PRN

## 2023-05-31 MED ORDER — HYDROMORPHONE HCL 1 MG/ML IJ SOLN
INTRAMUSCULAR | Status: AC
  Administered 2023-05-31: 0.5 mg via INTRAVENOUS
  Filled 2023-05-31: qty 1

## 2023-05-31 MED ORDER — MIDAZOLAM HCL 2 MG/2ML IJ SOLN
INTRAMUSCULAR | Status: DC | PRN
  Administered 2023-05-31 (×2): 1 mg via INTRAVENOUS

## 2023-05-31 MED ORDER — DOCUSATE SODIUM 100 MG PO CAPS
100.0000 mg | ORAL_CAPSULE | Freq: Two times a day (BID) | ORAL | Status: DC
  Administered 2023-05-31 – 2023-06-01 (×3): 100 mg via ORAL
  Filled 2023-05-31 (×3): qty 1

## 2023-05-31 MED ORDER — LIDOCAINE 2% (20 MG/ML) 5 ML SYRINGE
INTRAMUSCULAR | Status: AC
  Filled 2023-05-31: qty 5

## 2023-05-31 MED ORDER — OXYCODONE HCL 5 MG PO TABS: 5.0000 mg | ORAL_TABLET | Freq: Once | ORAL | Status: DC | PRN

## 2023-05-31 MED ORDER — HYDROMORPHONE HCL 1 MG/ML IJ SOLN: 0.5000 mg | INTRAMUSCULAR | Status: DC | PRN

## 2023-05-31 MED ORDER — ACETAMINOPHEN 10 MG/ML IV SOLN: 1000.0000 mg | Freq: Once | INTRAVENOUS | Status: DC | PRN

## 2023-05-31 MED ORDER — LIDOCAINE-EPINEPHRINE 1 %-1:100000 IJ SOLN
INTRAMUSCULAR | Status: DC | PRN
  Administered 2023-05-31: 10 mL

## 2023-05-31 MED ORDER — ONDANSETRON HCL 4 MG PO TABS: 4.0000 mg | ORAL_TABLET | Freq: Four times a day (QID) | ORAL | Status: DC | PRN

## 2023-05-31 MED ORDER — SUGAMMADEX SODIUM 200 MG/2ML IV SOLN
INTRAVENOUS | Status: DC | PRN
  Administered 2023-05-31: 200 mg via INTRAVENOUS

## 2023-05-31 MED ORDER — INSULIN ASPART 100 UNIT/ML IJ SOLN: 0.0000 [IU] | Freq: Three times a day (TID) | INTRAMUSCULAR | Status: DC

## 2023-05-31 MED ORDER — FENTANYL CITRATE (PF) 250 MCG/5ML IJ SOLN
INTRAMUSCULAR | Status: DC | PRN
  Administered 2023-05-31 (×2): 50 ug via INTRAVENOUS
  Administered 2023-05-31: 100 ug via INTRAVENOUS
  Administered 2023-05-31 (×4): 50 ug via INTRAVENOUS
  Administered 2023-05-31: 100 ug via INTRAVENOUS

## 2023-05-31 MED ORDER — ONDANSETRON HCL 4 MG/2ML IJ SOLN: 4.0000 mg | Freq: Four times a day (QID) | INTRAMUSCULAR | Status: DC | PRN

## 2023-05-31 MED ORDER — GABAPENTIN 300 MG PO CAPS
600.0000 mg | ORAL_CAPSULE | Freq: Three times a day (TID) | ORAL | Status: DC
  Administered 2023-05-31 – 2023-06-01 (×3): 600 mg via ORAL
  Filled 2023-05-31 (×3): qty 2

## 2023-05-31 MED ORDER — PROPOFOL 10 MG/ML IV BOLUS
INTRAVENOUS | Status: DC | PRN
  Administered 2023-05-31: 120 mg via INTRAVENOUS

## 2023-05-31 MED ORDER — DEXAMETHASONE SODIUM PHOSPHATE 10 MG/ML IJ SOLN
INTRAMUSCULAR | Status: DC | PRN
  Administered 2023-05-31: 10 mg via INTRAVENOUS

## 2023-05-31 MED ORDER — METHOCARBAMOL 500 MG PO TABS
ORAL_TABLET | ORAL | Status: AC
  Administered 2023-05-31: 1000 mg via ORAL
  Filled 2023-05-31: qty 1

## 2023-05-31 MED ORDER — INSULIN ASPART 100 UNIT/ML IJ SOLN: 0.0000 [IU] | Freq: Every day | INTRAMUSCULAR | Status: DC

## 2023-05-31 MED ORDER — CEFAZOLIN SODIUM-DEXTROSE 2-4 GM/100ML-% IV SOLN
2.0000 g | INTRAVENOUS | Status: AC
  Administered 2023-05-31 (×2): 2 g via INTRAVENOUS
  Filled 2023-05-31: qty 100

## 2023-05-31 MED ORDER — ONDANSETRON HCL 4 MG/2ML IJ SOLN: 4.0000 mg | Freq: Once | INTRAMUSCULAR | Status: DC | PRN

## 2023-05-31 MED ORDER — B-12 3000 MCG PO CAPS: 3000.0000 ug | ORAL_CAPSULE | Freq: Every day | ORAL | Status: DC

## 2023-05-31 MED ORDER — ONDANSETRON HCL 4 MG/2ML IJ SOLN
INTRAMUSCULAR | Status: AC
  Filled 2023-05-31: qty 2

## 2023-05-31 MED ORDER — METHOCARBAMOL 750 MG PO TABS
750.0000 mg | ORAL_TABLET | Freq: Four times a day (QID) | ORAL | Status: DC
  Administered 2023-05-31 – 2023-06-01 (×3): 750 mg via ORAL
  Filled 2023-05-31 (×3): qty 1

## 2023-05-31 MED ORDER — CLONAZEPAM 0.5 MG PO TABS: 1.0000 mg | ORAL_TABLET | Freq: Two times a day (BID) | ORAL | Status: DC | PRN

## 2023-05-31 MED ORDER — OXYCODONE HCL 5 MG PO TABS
10.0000 mg | ORAL_TABLET | ORAL | Status: DC | PRN
  Administered 2023-05-31 – 2023-06-01 (×5): 10 mg via ORAL
  Filled 2023-05-31 (×5): qty 2

## 2023-05-31 MED ORDER — 0.9 % SODIUM CHLORIDE (POUR BTL) OPTIME
TOPICAL | Status: DC | PRN
  Administered 2023-05-31: 1000 mL

## 2023-05-31 MED ORDER — METHOCARBAMOL 500 MG PO TABS
ORAL_TABLET | ORAL | Status: AC
  Filled 2023-05-31: qty 1

## 2023-05-31 MED ORDER — ROCURONIUM BROMIDE 10 MG/ML (PF) SYRINGE
PREFILLED_SYRINGE | INTRAVENOUS | Status: AC
  Filled 2023-05-31: qty 10

## 2023-05-31 MED ORDER — HYDROMORPHONE HCL 1 MG/ML IJ SOLN
INTRAMUSCULAR | Status: DC | PRN
Start: 2023-05-31 — End: 2023-05-31
  Administered 2023-05-31: .5 mg via INTRAVENOUS

## 2023-05-31 MED ORDER — LIDOCAINE-EPINEPHRINE 1 %-1:100000 IJ SOLN
INTRAMUSCULAR | Status: AC
  Filled 2023-05-31: qty 1

## 2023-05-31 MED ORDER — ROCURONIUM BROMIDE 10 MG/ML (PF) SYRINGE
PREFILLED_SYRINGE | INTRAVENOUS | Status: DC | PRN
  Administered 2023-05-31: 100 mg via INTRAVENOUS
  Administered 2023-05-31: 20 mg via INTRAVENOUS
  Administered 2023-05-31 (×3): 10 mg via INTRAVENOUS

## 2023-05-31 MED ORDER — PROPOFOL 10 MG/ML IV BOLUS
INTRAVENOUS | Status: AC
  Filled 2023-05-31: qty 20

## 2023-05-31 MED ORDER — HYDROMORPHONE HCL 1 MG/ML IJ SOLN
INTRAMUSCULAR | Status: AC
  Filled 2023-05-31: qty 0.5

## 2023-05-31 MED ORDER — CEFAZOLIN SODIUM 1 G IJ SOLR
INTRAMUSCULAR | Status: AC
  Filled 2023-05-31: qty 20

## 2023-05-31 MED ORDER — MENTHOL 3 MG MT LOZG: 1.0000 | LOZENGE | OROMUCOSAL | Status: DC | PRN

## 2023-05-31 MED ORDER — LOSARTAN POTASSIUM 50 MG PO TABS
100.0000 mg | ORAL_TABLET | Freq: Every day | ORAL | Status: DC
  Administered 2023-05-31 – 2023-06-01 (×2): 100 mg via ORAL
  Filled 2023-05-31 (×2): qty 2

## 2023-05-31 MED ORDER — LIDOCAINE HCL (CARDIAC) PF 100 MG/5ML IV SOSY
PREFILLED_SYRINGE | INTRAVENOUS | Status: DC | PRN
  Administered 2023-05-31: 100 mg via INTRAVENOUS

## 2023-05-31 MED ORDER — PHENYLEPHRINE 80 MCG/ML (10ML) SYRINGE FOR IV PUSH (FOR BLOOD PRESSURE SUPPORT)
PREFILLED_SYRINGE | INTRAVENOUS | Status: AC
  Filled 2023-05-31: qty 10

## 2023-05-31 MED ORDER — THROMBIN 5000 UNITS EX SOLR
OROMUCOSAL | Status: DC | PRN
  Administered 2023-05-31: 5 mL via TOPICAL

## 2023-05-31 MED ORDER — SODIUM CHLORIDE 0.9% FLUSH: 3.0000 mL | INTRAVENOUS | Status: DC | PRN

## 2023-05-31 MED ORDER — PHENOL 1.4 % MT LIQD: 1.0000 | OROMUCOSAL | Status: DC | PRN

## 2023-05-31 MED ORDER — CEFAZOLIN SODIUM-DEXTROSE 2-4 GM/100ML-% IV SOLN
2.0000 g | Freq: Three times a day (TID) | INTRAVENOUS | Status: AC
  Administered 2023-05-31 – 2023-06-01 (×2): 2 g via INTRAVENOUS
  Filled 2023-05-31 (×2): qty 100

## 2023-05-31 MED ORDER — MAGNESIUM OXIDE -MG SUPPLEMENT 400 (240 MG) MG PO TABS
400.0000 mg | ORAL_TABLET | Freq: Every day | ORAL | Status: DC
  Administered 2023-05-31 – 2023-06-01 (×2): 400 mg via ORAL
  Filled 2023-05-31 (×2): qty 1

## 2023-05-31 MED ORDER — CYCLOBENZAPRINE HCL 10 MG PO TABS
10.0000 mg | ORAL_TABLET | Freq: Three times a day (TID) | ORAL | Status: DC | PRN
  Administered 2023-06-01: 10 mg via ORAL
  Filled 2023-05-31: qty 1

## 2023-05-31 MED ORDER — ONDANSETRON HCL 4 MG/2ML IJ SOLN
INTRAMUSCULAR | Status: DC | PRN
  Administered 2023-05-31: 4 mg via INTRAVENOUS

## 2023-05-31 MED ORDER — METHOCARBAMOL 500 MG PO TABS: 1000.0000 mg | ORAL_TABLET | Freq: Once | ORAL | Status: AC

## 2023-05-31 MED ORDER — POLYETHYLENE GLYCOL 3350 17 G PO PACK: 17.0000 g | PACK | Freq: Every day | ORAL | Status: DC | PRN

## 2023-05-31 MED ORDER — FENTANYL CITRATE (PF) 250 MCG/5ML IJ SOLN
INTRAMUSCULAR | Status: AC
  Filled 2023-05-31: qty 5

## 2023-05-31 MED ORDER — FUROSEMIDE 40 MG PO TABS: 40.0000 mg | ORAL_TABLET | Freq: Every day | ORAL | Status: DC | PRN

## 2023-05-31 MED ORDER — LACTATED RINGERS IV SOLN: INTRAVENOUS | Status: DC

## 2023-05-31 MED ORDER — MIDAZOLAM HCL 2 MG/2ML IJ SOLN
INTRAMUSCULAR | Status: AC
  Filled 2023-05-31: qty 2

## 2023-05-31 MED ORDER — SODIUM CHLORIDE 0.9 % IV SOLN
250.0000 mL | INTRAVENOUS | Status: DC
  Administered 2023-05-31: 250 mL via INTRAVENOUS

## 2023-05-31 MED ORDER — ACETAMINOPHEN 325 MG PO TABS: 650.0000 mg | ORAL_TABLET | ORAL | Status: DC | PRN

## 2023-05-31 MED ORDER — PHENYLEPHRINE 80 MCG/ML (10ML) SYRINGE FOR IV PUSH (FOR BLOOD PRESSURE SUPPORT)
PREFILLED_SYRINGE | INTRAVENOUS | Status: DC | PRN
Start: 2023-05-31 — End: 2023-05-31
  Administered 2023-05-31: 160 ug via INTRAVENOUS
  Administered 2023-05-31 (×2): 80 ug via INTRAVENOUS

## 2023-05-31 SURGICAL SUPPLY — 84 items
ADH SKN CLS APL DERMABOND .7 (GAUZE/BANDAGES/DRESSINGS) ×1
ADH SKN CLS LQ APL DERMABOND (GAUZE/BANDAGES/DRESSINGS) ×1
APL SKNCLS STERI-STRIP NONHPOA (GAUZE/BANDAGES/DRESSINGS)
BAG COUNTER SPONGE SURGICOUNT (BAG) ×1 IMPLANT
BAG SPNG CNTER NS LX DISP (BAG) ×1
BASKET BONE COLLECTION (BASKET) ×1 IMPLANT
BENZOIN TINCTURE PRP APPL 2/3 (GAUZE/BANDAGES/DRESSINGS) IMPLANT
BLADE BONE MILL MEDIUM (MISCELLANEOUS) IMPLANT
BLADE CLIPPER SURG (BLADE) IMPLANT
BLADE SURG 11 STRL SS (BLADE) ×1 IMPLANT
BUR 14 MATCH 3 (BUR) IMPLANT
BUR MATCHSTICK NEURO 3.0 LAGG (BURR) ×1 IMPLANT
BUR MR8 14CM BALL SYMTRI 5 (BUR) IMPLANT
BUR PRECISION FLUTE 5.0 (BURR) ×1 IMPLANT
BURR 14 MATCH 3 (BUR) ×1
BURR MR8 14CM BALL SYMTRI 5 (BUR)
CAGE INTERBODY PL LG 7X26.5X24 (Cage) IMPLANT
CANISTER SUCT 3000ML PPV (MISCELLANEOUS) ×1 IMPLANT
CNTNR URN SCR LID CUP LEK RST (MISCELLANEOUS) ×1 IMPLANT
CONT SPEC 4OZ STRL OR WHT (MISCELLANEOUS) ×1
COVER BACK TABLE 60X90IN (DRAPES) ×1 IMPLANT
COVERAGE SUPPORT O-ARM STEALTH (MISCELLANEOUS) ×1 IMPLANT
DERMABOND ADVANCED .7 DNX12 (GAUZE/BANDAGES/DRESSINGS) ×1 IMPLANT
DERMABOND ADVANCED .7 DNX6 (GAUZE/BANDAGES/DRESSINGS) IMPLANT
DRAPE C-ARM 42X72 X-RAY (DRAPES) IMPLANT
DRAPE C-ARMOR (DRAPES) IMPLANT
DRAPE LAPAROTOMY 100X72X124 (DRAPES) ×1 IMPLANT
DRAPE MICROSCOPE SLANT 54X150 (MISCELLANEOUS) IMPLANT
DRAPE SHEET LG 3/4 BI-LAMINATE (DRAPES) ×4 IMPLANT
DRAPE SURG 17X23 STRL (DRAPES) ×1 IMPLANT
DURAPREP 26ML APPLICATOR (WOUND CARE) ×1 IMPLANT
ELECT REM PT RETURN 9FT ADLT (ELECTROSURGICAL) ×1
ELECTRODE REM PT RTRN 9FT ADLT (ELECTROSURGICAL) ×1 IMPLANT
FEE COVERAGE SUPPORT O-ARM (MISCELLANEOUS) ×1 IMPLANT
GAUZE 4X4 16PLY ~~LOC~~+RFID DBL (SPONGE) IMPLANT
GAUZE SPONGE 4X4 12PLY STRL (GAUZE/BANDAGES/DRESSINGS) IMPLANT
GLOVE BIO SURGEON STRL SZ7.5 (GLOVE) ×2 IMPLANT
GLOVE BIOGEL PI IND STRL 7.0 (GLOVE) ×2 IMPLANT
GLOVE BIOGEL PI IND STRL 7.5 (GLOVE) ×2 IMPLANT
GLOVE ECLIPSE 7.5 STRL STRAW (GLOVE) ×2 IMPLANT
GLOVE EXAM NITRILE LRG STRL (GLOVE) IMPLANT
GLOVE EXAM NITRILE XL STR (GLOVE) IMPLANT
GLOVE EXAM NITRILE XS STR PU (GLOVE) IMPLANT
GOWN STRL REUS W/ TWL LRG LVL3 (GOWN DISPOSABLE) ×4 IMPLANT
GOWN STRL REUS W/ TWL XL LVL3 (GOWN DISPOSABLE) IMPLANT
GOWN STRL REUS W/TWL 2XL LVL3 (GOWN DISPOSABLE) IMPLANT
GOWN STRL REUS W/TWL LRG LVL3 (GOWN DISPOSABLE) ×2
GOWN STRL REUS W/TWL XL LVL3 (GOWN DISPOSABLE) ×2
HEMOSTAT POWDER KIT SURGIFOAM (HEMOSTASIS) ×1 IMPLANT
KIT BASIN OR (CUSTOM PROCEDURE TRAY) ×1 IMPLANT
KIT INFUSE MEDIUM (Orthopedic Implant) IMPLANT
KIT POSITION SURG JACKSON T1 (MISCELLANEOUS) ×1 IMPLANT
KIT TURNOVER KIT B (KITS) ×1 IMPLANT
MARKER SPHERE PSV REFLC NDI (MISCELLANEOUS) ×5 IMPLANT
MILL BONE PREP (MISCELLANEOUS) IMPLANT
NDL HYPO 18GX1.5 BLUNT FILL (NEEDLE) IMPLANT
NDL HYPO 22X1.5 SAFETY MO (MISCELLANEOUS) ×1 IMPLANT
NDL SPNL 18GX3.5 QUINCKE PK (NEEDLE) IMPLANT
NEEDLE HYPO 18GX1.5 BLUNT FILL (NEEDLE) IMPLANT
NEEDLE HYPO 22X1.5 SAFETY MO (MISCELLANEOUS) ×1 IMPLANT
NEEDLE SPNL 18GX3.5 QUINCKE PK (NEEDLE) IMPLANT
NS IRRIG 1000ML POUR BTL (IV SOLUTION) ×1 IMPLANT
PACK LAMINECTOMY NEURO (CUSTOM PROCEDURE TRAY) ×1 IMPLANT
PAD ARMBOARD 7.5X6 YLW CONV (MISCELLANEOUS) ×3 IMPLANT
RASP 3.0MM (RASP) IMPLANT
ROD 5.5MM SPINAL SOLERA (Rod) IMPLANT
SCREW 6.5X40 (Screw) IMPLANT
SCREW SET SOLERA (Screw) ×12 IMPLANT
SCREW SET SOLERA TI5.5 (Screw) IMPLANT
SCREW SOLERA 45X6.5XMA NS SPNE (Screw) IMPLANT
SCREW SOLERA 6.5X45MM (Screw) ×6 IMPLANT
SPIKE FLUID TRANSFER (MISCELLANEOUS) ×1 IMPLANT
SPONGE SURGIFOAM ABS GEL 100 (HEMOSTASIS) IMPLANT
SPONGE T-LAP 4X18 ~~LOC~~+RFID (SPONGE) IMPLANT
STRIP CLOSURE SKIN 1/2X4 (GAUZE/BANDAGES/DRESSINGS) IMPLANT
SUT MNCRL AB 3-0 PS2 18 (SUTURE) ×1 IMPLANT
SUT VIC AB 0 CT1 18XCR BRD8 (SUTURE) ×1 IMPLANT
SUT VIC AB 0 CT1 8-18 (SUTURE) ×3
SUT VIC AB 2-0 CP2 18 (SUTURE) ×1 IMPLANT
SYR 3ML LL SCALE MARK (SYRINGE) IMPLANT
TOWEL GREEN STERILE (TOWEL DISPOSABLE) ×1 IMPLANT
TOWEL GREEN STERILE FF (TOWEL DISPOSABLE) ×1 IMPLANT
TRAY FOLEY MTR SLVR 16FR STAT (SET/KITS/TRAYS/PACK) ×1 IMPLANT
WATER STERILE IRR 1000ML POUR (IV SOLUTION) ×1 IMPLANT

## 2023-05-31 NOTE — Anesthesia Preprocedure Evaluation (Addendum)
Anesthesia Evaluation  Patient identified by MRN, date of birth, ID band Patient awake    Reviewed: Allergy & Precautions, NPO status , Patient's Chart, lab work & pertinent test results, reviewed documented beta blocker date and time   History of Anesthesia Complications (+) history of anesthetic complications  Airway Mallampati: IV  TM Distance: >3 FB Neck ROM: Limited    Dental no notable dental hx.    Pulmonary neg COPD, Recent URI  (covid 6 weeks ago, residual nonproductive cough)   breath sounds clear to auscultation       Cardiovascular hypertension, (-) angina (-) CAD, (-) Past MI, (-) Cardiac Stents, (-) CABG, (-) CHF and (-) Orthopnea (-) dysrhythmias (-) Valvular Problems/Murmurs Rhythm:Regular Rate:Normal     Neuro/Psych  Headaches, neg Seizures PSYCHIATRIC DISORDERS Anxiety      Neuromuscular disease    GI/Hepatic ,,,(+) neg Cirrhosis        Endo/Other  diabetes, Type 2    Renal/GU Renal disease     Musculoskeletal  (+) Arthritis ,    Abdominal   Peds  Hematology   Anesthesia Other Findings   Reproductive/Obstetrics                             Anesthesia Physical Anesthesia Plan  ASA: 3  Anesthesia Plan: General   Post-op Pain Management:    Induction:   PONV Risk Score and Plan: 2 and Ondansetron and Dexamethasone  Airway Management Planned: Oral ETT and Video Laryngoscope Planned  Additional Equipment:   Intra-op Plan:   Post-operative Plan: Extubation in OR  Informed Consent: I have reviewed the patients History and Physical, chart, labs and discussed the procedure including the risks, benefits and alternatives for the proposed anesthesia with the patient or authorized representative who has indicated his/her understanding and acceptance.     Dental advisory given  Plan Discussed with: CRNA  Anesthesia Plan Comments:         Anesthesia Quick  Evaluation

## 2023-05-31 NOTE — Op Note (Signed)
PATIENT: Chris Flynn  DAY OF SURGERY: 05/31/23   PRE-OPERATIVE DIAGNOSIS:  Lumbar radiculopathy, lumbar spondylolisthesis, lumbar canal stenosis with neurogenic claudication   POST-OPERATIVE DIAGNOSIS:  Same   PROCEDURE:  L2-3, L3-4 open decompressive laminectomies; L1-2, L2-3, L3-4, L5-S1 posterolateral instrumented fusion; L5-S1 left transforaminal lumbar interbody fusion, extension of prior L4-5 fusion to now span L1-S1, use of intraoperative CT and frameless stereotactic navigation    SURGEON:  Surgeon(s) and Role:    Chris Pierini, MD    Chris Hero PA   ANESTHESIA: ETGA   BRIEF HISTORY: This is a 67 year old man in whom I have previously performed an MIS discectomy, MIS synovial cyst resection, and MIS L4-5 TLIF who presented with worsening low back pain and recurrent bilateral lower extremity radicular pain. The patient was found to have progression of his degenerative disease with worsening symptomatic stenosis. We discussed options, given the extent of his progression   OPERATIVE DETAIL: The patient was taken to the operating room and anesthesia was induced by the anesthesia team. They were placed on the OR table in the prone position with padding of all pressure points. A formal time out was performed with two patient identifiers and confirmed the operative site. The operative site was marked, hair was clipped with surgical clippers, the area was then prepped and draped in a sterile fashion. A midline incision was placed to expose from L1 to S1. Subperiosteal dissection was then performed bilaterally. The prior caps and then rods were removed bilaterally and the CT was obtained.   A spinous process clamp was applied and secured, followed by attachment of a reference array. The field was draped and the O-arm was brought into the field. An intra-op CT was performed, sent to the Stealth navigation station, registered to the patient's anatomy, and confirmed with landmarks  with acceptable fit. Stereotactic spinal navigation was utilized throughout the procedure for planning and placement of pedicle screw trajectories.  Instrumentation was then performed. Frameless stereotaxy was used to guide placement of bilateral pedicle screws (Medtronic) at L1, L2, L3, and S1. These were placed with navigated instruments by localizing the pedicle, drilling a pilot hole, cannulating the pedicle with an awl-tap, palpating for pedicle wall breaches, and then placing the screw.   The TLIF was then performed at L5-S1 on the left using navigation. Retractors were used on the L5 and S1 pedicle screws for distraction and to allow a corridor for working. A facetectomy was performed on the left at L5-S1, exiting and traversing roots identified and protected, discectomy performed, endplates prepped, rBMP was placed into the disc space followed by autograft then an expandable titanium interbody (Medtronic) was placed using navigation, expanded, back-filled, and released.   The spinous process clamp was removed and decompression was performed, which consisted of laminectomies at L2-3 and L3-4. There was thick bone with calcified, adherent ligament and scar tissue that made this quite difficult. At L2-3, the remnant of the synovial cyst was present without recurrence, but densely scarred in place. Decompression was completed and attention was turned to completion of instrumentation.   The pedicle screws were connected with rods bilaterally and final tightened according to manufacturer torque specifications. The bone was thoroughly decorticated over the fusion surface and the previously resected bone fragments were morselized and used as autograft with the addition of rBMP along the posterolateral fusion surfaces from L1-S1. On final xrays, the left sided rod was longer than needed inferior to the S1 screw, so this was cut to  avoid any irritation from the rod.   Chris Consentino PA was scrubbed and  assisted with the entire procedure which included exposure, instrumentation, decompression, fusion, and closure.  All instrument and sponge counts were correct, the incision was copiously irrigated multiple times, hemostasis was obtained, and the incision was then closed in layers. The patient was then returned to anesthesia for emergence. No apparent complications at the completion of the procedure.   EBL:    DRAINS: none   SPECIMENS: none   Chris Pierini, MD

## 2023-05-31 NOTE — Anesthesia Procedure Notes (Signed)
Procedure Name: Intubation Date/Time: 05/31/2023 8:00 AM  Performed by: Yolonda Kida, CRNAPre-anesthesia Checklist: Patient identified, Emergency Drugs available, Suction available and Patient being monitored Patient Re-evaluated:Patient Re-evaluated prior to induction Oxygen Delivery Method: Circle System Utilized Preoxygenation: Pre-oxygenation with 100% oxygen Induction Type: IV induction Ventilation: Mask ventilation without difficulty Laryngoscope Size: Glidescope and 4 Grade View: Grade I Tube type: Oral Tube size: 7.5 mm Number of attempts: 1 Airway Equipment and Method: Stylet and Video-laryngoscopy Placement Confirmation: ETT inserted through vocal cords under direct vision, positive ETCO2 and breath sounds checked- equal and bilateral Secured at: 23 cm Tube secured with: Tape Dental Injury: Teeth and Oropharynx as per pre-operative assessment

## 2023-05-31 NOTE — Progress Notes (Signed)
Pt reports being COVID positive on 04/17/23. Pt states he still has a minimal lingering cough. Pt states he coughs up "very little". Pt reports completing doses of antibiotics and prednisone. Pt had CXR done on 05/28/23. Dr. Ace Gins notified.

## 2023-05-31 NOTE — H&P (Signed)
Surgical H&P Update  HPI: 67 y.o. with a history of prior MIS resection of synovial cyst, MIS discectomy, and prior MIS TLIF with recurrent low back and leg pain. Workup showed progression of degenerative disease at multiple levels. No new symptoms today.  PMHx:  Past Medical History:  Diagnosis Date   Arthritis    Complication of anesthesia    difficulty waking up after back surgery   Diabetes (HCC)    type 2   Dizziness    Headache    History of alcohol abuse    sober since 2017   Hypertension    Neuromuscular disorder (HCC)    diabetic neuropathy in feet   Positive colorectal cancer screening using Cologuard test    Post concussive syndrome    PTSD (post-traumatic stress disorder)    Short-term memory loss    FamHx:  Family History  Problem Relation Age of Onset   COPD Mother    Lung cancer Father    SocHx:  reports that he has never smoked. He has never used smokeless tobacco. He reports that he does not drink alcohol and does not use drugs.  Physical Exam: Strength 5/5 x4 and SILTx4   Assesment/Plan: 67 y.o. man with progression of multiple level degenerative lumbar disease, here for open L1-S1 decompression and instrumented fusion. Risks, benefits, and alternatives discussed and the patient would like to continue with surgery.  -OR today -3C post-op  Jadene Pierini, MD 05/31/23 7:34 AM

## 2023-05-31 NOTE — Transfer of Care (Signed)
Immediate Anesthesia Transfer of Care Note  Patient: Chris Flynn  Procedure(s) Performed: Revision Lumbar one-Sacral one laminectomy and transforaminal lumbar interbody fusion with posterolateral instrumentation, exploration of hardware, lumbar one -sacral one Application of O-Arm  Patient Location: PACU  Anesthesia Type:General  Level of Consciousness: awake, alert , oriented, drowsy, and patient cooperative  Airway & Oxygen Therapy: Patient Spontanous Breathing and Patient connected to nasal cannula oxygen  Post-op Assessment: Report given to RN and Post -op Vital signs reviewed and stable  Post vital signs: Reviewed and stable  Last Vitals:  Vitals Value Taken Time  BP 159/95 05/31/23 1320  Temp 36.5 C 05/31/23 1320  Pulse 97 05/31/23 1321  Resp 12 05/31/23 1321  SpO2 93 % 05/31/23 1321  Vitals shown include unfiled device data.  Last Pain:  Vitals:   05/31/23 0614  TempSrc:   PainSc: 8          Complications: No notable events documented.

## 2023-05-31 NOTE — Anesthesia Postprocedure Evaluation (Signed)
Anesthesia Post Note  Patient: TIMONTHY HOVATER  Procedure(s) Performed: Revision Lumbar one-Sacral one laminectomy and transforaminal lumbar interbody fusion with posterolateral instrumentation, exploration of hardware, lumbar one -sacral one Application of O-Arm     Patient location during evaluation: PACU Anesthesia Type: General Level of consciousness: awake and alert Pain management: pain level controlled Vital Signs Assessment: post-procedure vital signs reviewed and stable Respiratory status: spontaneous breathing, nonlabored ventilation, respiratory function stable and patient connected to nasal cannula oxygen Cardiovascular status: blood pressure returned to baseline and stable Postop Assessment: no apparent nausea or vomiting Anesthetic complications: no   No notable events documented.  Last Vitals:  Vitals:   05/31/23 1433 05/31/23 1554  BP: 137/78 (!) 143/72  Pulse: 96 98  Resp: 20 20  Temp: 37 C 36.9 C  SpO2: 96% 96%    Last Pain:  Vitals:   05/31/23 1554  TempSrc: Oral  PainSc:                  Mariann Barter

## 2023-06-01 ENCOUNTER — Encounter (HOSPITAL_COMMUNITY): Payer: Self-pay | Admitting: Neurological Surgery

## 2023-06-01 LAB — CBC
HCT: 36.7 % — ABNORMAL LOW (ref 39.0–52.0)
Hemoglobin: 12.6 g/dL — ABNORMAL LOW (ref 13.0–17.0)
MCH: 33.8 pg (ref 26.0–34.0)
MCHC: 34.3 g/dL (ref 30.0–36.0)
MCV: 98.4 fL (ref 80.0–100.0)
Platelets: 174 10*3/uL (ref 150–400)
RBC: 3.73 MIL/uL — ABNORMAL LOW (ref 4.22–5.81)
RDW: 13.1 % (ref 11.5–15.5)
WBC: 10.3 10*3/uL (ref 4.0–10.5)
nRBC: 0 % (ref 0.0–0.2)

## 2023-06-01 LAB — GLUCOSE, CAPILLARY: Glucose-Capillary: 107 mg/dL — ABNORMAL HIGH (ref 70–99)

## 2023-06-01 MED ORDER — OXYCODONE-ACETAMINOPHEN 10-325 MG PO TABS: 1.0000 | ORAL_TABLET | ORAL | 0 refills | Status: AC | PRN

## 2023-06-01 MED ORDER — ASPIRIN 325 MG PO TABS: 325.0000 mg | ORAL_TABLET | Freq: Every day | ORAL | Status: AC

## 2023-06-01 NOTE — Evaluation (Signed)
Physical Therapy Evaluation  Patient Details Name: Chris Flynn MRN: [id number] DOB: Jul 11, 1956 Today's Date: 06/01/2023  History of Present Illness  Pt is a 67 y/o M who presents s/p PLIF revision and extension of previous TLIF from L1-S1 on 05/31/2023.  PMH significant for DM2, STM deficits, CA.  Clinical Impression  Pt admitted with above diagnosis. At the time of PT eval, pt was able to demonstrate transfers and ambulation with gross CGA to supervision for safety and RW for support. Pt was educated on precautions, brace application/wearing schedule, appropriate activity progression, and car transfer. Pt currently with functional limitations due to the deficits listed below (see PT Problem List). Pt will benefit from skilled PT to increase their independence and safety with mobility to allow discharge to the venue listed below.          If plan is discharge home, recommend the following: A little help with walking and/or transfers;A little help with bathing/dressing/bathroom;Assistance with cooking/housework;Assist for transportation;Help with stairs or ramp for entrance   Can travel by private vehicle        Equipment Recommendations None recommended by PT  Recommendations for Other Services       Functional Status Assessment Patient has had a recent decline in their functional status and demonstrates the ability to make significant improvements in function in a reasonable and predictable amount of time.     Precautions / Restrictions Precautions Precautions: Back;Fall Precaution Booklet Issued: Yes (comment) Required Braces or Orthoses: Spinal Brace Spinal Brace: Lumbar corset Restrictions Weight Bearing Restrictions: No      Mobility  Bed Mobility               General bed mobility comments: Pt was received sitting up in recliner.    Transfers Overall transfer level: Needs assistance Equipment used: Rolling walker (2 wheels) Transfers: Sit to/from Stand,  Bed to chair/wheelchair/BSC Sit to Stand: Supervision           General transfer comment: VC's for hand placement on seated surface for safety. No assist required. Recommended wide BOS for improved control of stand>sit.    Ambulation/Gait Ambulation/Gait assistance: Contact guard assist, Supervision Gait Distance (Feet): 300 Feet Assistive device: Rolling walker (2 wheels) Gait Pattern/deviations: Step-through pattern, Decreased stride length, Trunk flexed Gait velocity: Decreased Gait velocity interpretation: 1.31 - 2.62 ft/sec, indicative of limited community ambulator   General Gait Details: Sow and guarded due to pain. No assist required but hands on guarding provided for safety progressing to supervision by end of session.  Stairs Stairs: Yes Stairs assistance: Contact guard assist Stair Management: One rail Right, Step to pattern, Forwards Number of Stairs: 10 General stair comments: Slow and guarded. Steps appear effortful at times but pt does not report increased pain. VC's throughout for sequencing and general safety.  Wheelchair Mobility     Tilt Bed    Modified Rankin (Stroke Patients Only)       Balance Overall balance assessment: Needs assistance Sitting-balance support: Feet supported Sitting balance-Leahy Scale: Good     Standing balance support: Reliant on assistive device for balance Standing balance-Leahy Scale: Fair                               Pertinent Vitals/Pain Pain Assessment Pain Assessment: Faces Faces Pain Scale: Hurts even more Pain Descriptors / Indicators: Grimacing, Guarding Pain Intervention(s): Limited activity within patient's tolerance, Monitored during session, Repositioned    Home Living Family/patient  expects to be discharged to:: Private residence Living Arrangements: Spouse/significant other Available Help at Discharge: Family;Available 24 hours/day Type of Home: House Home Access: Level entry      Alternate Level Stairs-Number of Steps: 16 Home Layout: Two level;1/2 bath on main level;Bed/bath upstairs Home Equipment: Rolling Walker (2 wheels);Shower seat;Adaptive equipment      Prior Function Prior Level of Function : Independent/Modified Independent;Driving                     Extremity/Trunk Assessment   Upper Extremity Assessment Upper Extremity Assessment: Defer to OT evaluation    Lower Extremity Assessment Lower Extremity Assessment: Generalized weakness (Mild; consistent with pre-op diagnosis)    Cervical / Trunk Assessment Cervical / Trunk Assessment: Back Surgery  Communication   Communication Communication: No apparent difficulties Cueing Techniques: Verbal cues;Gestural cues  Cognition Arousal: Alert Behavior During Therapy: WFL for tasks assessed/performed Overall Cognitive Status: Within Functional Limits for tasks assessed                                          General Comments      Exercises     Assessment/Plan    PT Assessment Patient needs continued PT services  PT Problem List Decreased strength;Decreased activity tolerance;Decreased balance;Decreased mobility;Decreased knowledge of use of DME;Decreased safety awareness;Decreased knowledge of precautions;Pain       PT Treatment Interventions DME instruction;Gait training;Stair training;Functional mobility training;Therapeutic activities;Therapeutic exercise;Balance training;Patient/family education    PT Goals (Current goals can be found in the Care Plan section)  Acute Rehab PT Goals Patient Stated Goal: Home today PT Goal Formulation: With patient Time For Goal Achievement: 06/08/23 Potential to Achieve Goals: Good    Frequency Min 5X/week     Co-evaluation               AM-PAC PT "6 Clicks" Mobility  Outcome Measure Help needed turning from your back to your side while in a flat bed without using bedrails?: A Little Help needed moving from  lying on your back to sitting on the side of a flat bed without using bedrails?: A Little Help needed moving to and from a bed to a chair (including a wheelchair)?: A Little Help needed standing up from a chair using your arms (e.g., wheelchair or bedside chair)?: A Little Help needed to walk in hospital room?: A Little Help needed climbing 3-5 steps with a railing? : A Little 6 Click Score: 18    End of Session Equipment Utilized During Treatment: Gait belt Activity Tolerance: Patient tolerated treatment well Patient left: in chair;with call bell/phone within reach Nurse Communication: Mobility status PT Visit Diagnosis: Unsteadiness on feet (R26.81);Pain Pain - part of body:  (back)    Time: 8416-6063 PT Time Calculation (min) (ACUTE ONLY): 26 min   Charges:   PT Evaluation $PT Eval Low Complexity: 1 Low PT Treatments $Gait Training: 8-22 mins PT General Charges $$ ACUTE PT VISIT: 1 Visit         Conni Slipper, PT, DPT Acute Rehabilitation Services Secure Chat Preferred Office: [phone]   Marylynn Pearson 06/01/2023, 11:49 AM

## 2023-06-01 NOTE — Plan of Care (Signed)
Pt doing well. Pt given D/C instructions with verbal understanding. Rx's were sent to the pharmacy by MD. Pt's incision is clean and dry with no sign of infection. Pt's IV was removed prior to D/C. Pt D/C'd home via wheelchair per MD order. Pt I stable @ D/C and has no other needs at this time. Rema Fendt, RN

## 2023-06-01 NOTE — Discharge Summary (Signed)
Discharge Summary  Date of Admission: 05/31/2023  Date of Discharge: 06/01/23  Attending Physician: Autumn Patty, MD  Hospital Course: Patient was admitted following an uncomplicated L2-3/3-4 decompression, extension of fusion from L1-S1. They were recovered in PACU and transferred to [facility]. His preop symptoms were improved, his hospital course was uncomplicated and the patient was discharged home. They will follow up in clinic with me in clinic in 2 weeks.  Neurologic exam at discharge:  Strength 5/5 x4 and SILTx4   Discharge diagnosis: Lumbar radiculopathy, lumbar spondylolisthesis, lumbar stenosis with neurogenic claudication   Jadene Pierini, MD 06/01/23 7:53 AM

## 2023-06-01 NOTE — Evaluation (Signed)
Occupational Therapy Evaluation Patient Details Name: Chris Flynn MRN: [id number] DOB: 03/17/1956 Today's Date: 06/01/2023   History of Present Illness 67 yo M s/p PLIF revision and extension of previous TLIF.  PMH: DM2, STM deficits, CA.   Clinical Impression   Patient admitted for the procedure above.  PTA he lives at home and does need occasional assist from his spouse for lower body ADL.  Currently he is very close to his baseline, and feels more comfortable using the RW for in room mobility.  Patient has a good understanding of all precautions, and no further OT needs exist in the acute setting.         If plan is discharge home, recommend the following: Assist for transportation;Assistance with cooking/housework;A little help with bathing/dressing/bathroom    Functional Status Assessment  Patient has not had a recent decline in their functional status  Equipment Recommendations  None recommended by OT    Recommendations for Other Services       Precautions / Restrictions Precautions Precautions: Back Precaution Booklet Issued: Yes (comment) Required Braces or Orthoses: Spinal Brace Spinal Brace: Lumbar corset Restrictions Weight Bearing Restrictions: No      Mobility Bed Mobility Overal bed mobility: Modified Independent                  Transfers Overall transfer level: Needs assistance Equipment used: Rolling walker (2 wheels) Transfers: Sit to/from Stand, Bed to chair/wheelchair/BSC Sit to Stand: Supervision     Step pivot transfers: Modified independent (Device/Increase time)            Balance Overall balance assessment: Needs assistance Sitting-balance support: Feet supported Sitting balance-Leahy Scale: Good     Standing balance support: Reliant on assistive device for balance Standing balance-Leahy Scale: Fair                             ADL either performed or assessed with clinical judgement   ADL Overall ADL's :  At baseline                                             Vision Baseline Vision/History: 1 Wears glasses Patient Visual Report: No change from baseline       Perception Perception: Within Functional Limits       Praxis Praxis: WFL       Pertinent Vitals/Pain Pain Assessment Pain Assessment: Faces Faces Pain Scale: Hurts even more Pain Descriptors / Indicators: Grimacing, Guarding Pain Intervention(s): Monitored during session     Extremity/Trunk Assessment Upper Extremity Assessment Upper Extremity Assessment: Overall WFL for tasks assessed   Lower Extremity Assessment Lower Extremity Assessment: Defer to PT evaluation   Cervical / Trunk Assessment Cervical / Trunk Assessment: Back Surgery   Communication Communication Communication: No apparent difficulties   Cognition Arousal: Alert Behavior During Therapy: WFL for tasks assessed/performed Overall Cognitive Status: Within Functional Limits for tasks assessed                                       General Comments   VSS    Exercises     Shoulder Instructions      Home Living Family/patient expects to be discharged to:: Private residence Living Arrangements: Spouse/significant other Available Help  at Discharge: Family;Available 24 hours/day Type of Home: House Home Access: Level entry     Home Layout: Two level;1/2 bath on main level;Bed/bath upstairs Alternate Level Stairs-Number of Steps: 16 Alternate Level Stairs-Rails: Right Bathroom Shower/Tub: Producer, television/film/video: Standard Bathroom Accessibility: Yes How Accessible: Accessible via walker Home Equipment: Rolling Walker (2 wheels);Shower seat;Adaptive equipment Adaptive Equipment: Reacher;Sock aid;Long-handled sponge        Prior Functioning/Environment Prior Level of Function : Independent/Modified Independent;Driving                        OT Problem List: Pain      OT  Treatment/Interventions:      OT Goals(Current goals can be found in the care plan section) Acute Rehab OT Goals Patient Stated Goal: Return home OT Goal Formulation: With patient Time For Goal Achievement: 06/04/23 Potential to Achieve Goals: Good  OT Frequency:      Co-evaluation              AM-PAC OT "6 Clicks" Daily Activity     Outcome Measure Help from another person eating meals?: None Help from another person taking care of personal grooming?: None Help from another person toileting, which includes using toliet, bedpan, or urinal?: A Little Help from another person bathing (including washing, rinsing, drying)?: A Little Help from another person to put on and taking off regular upper body clothing?: None Help from another person to put on and taking off regular lower body clothing?: A Little 6 Click Score: 21   End of Session Equipment Utilized During Treatment: Rolling walker (2 wheels) Nurse Communication: Mobility status  Activity Tolerance: Patient tolerated treatment well Patient left: in chair;with call bell/phone within reach  OT Visit Diagnosis: Unsteadiness on feet (R26.81)                Time: 4098-1191 OT Time Calculation (min): 21 min Charges:  OT General Charges $OT Visit: 1 Visit OT Evaluation $OT Eval Moderate Complexity: 1 Mod  06/01/2023  RP, OTR/L  Acute Rehabilitation Services  Office:  [phone]   Suzanna Obey 06/01/2023, 8:43 AM

## 2023-06-01 NOTE — Progress Notes (Signed)
Neurosurgery Service Progress Note  Subjective: No acute events overnight, no radicular pain, back / hip pain improved from preop, incisional pain tolerable   Objective: Vitals:   05/31/23 1942 05/31/23 2349 06/01/23 0415 06/01/23 0735  BP: (!) 153/71 138/64 (!) 143/73 (!) 126/53  Pulse: 91 72 73 86  Resp: 20 20 17 18   Temp: 98.6 F (37 C) 98.3 F (36.8 C) 98.1 F (36.7 C) 99 F (37.2 C)  TempSrc: Oral Oral Oral Oral  SpO2: 95% 96% 94% 99%  Weight:      Height:        Physical Exam: Strength 5/5 x4 and SILTx4   Assessment & Plan: 67 y.o. man s/p decompression / extension of prior fusion, now L1-S1, recovering well.  -discharge home today after working with PT/OT  Jadene Pierini  06/01/23 7:46 AM

## 2024-06-22 ENCOUNTER — Encounter (INDEPENDENT_AMBULATORY_CARE_PROVIDER_SITE_OTHER): Payer: Self-pay | Admitting: *Deleted

## 2024-07-24 ENCOUNTER — Encounter (INDEPENDENT_AMBULATORY_CARE_PROVIDER_SITE_OTHER): Admitting: Gastroenterology
